# Patient Record
Sex: Female | Born: 1960 | Race: White | Hispanic: No | Marital: Married | Smoking: Never smoker
Health system: Southern US, Community
[De-identification: ages and names within clinical notes are randomized; demographics above are authoritative.]

## PROBLEM LIST (undated history)

## (undated) ENCOUNTER — Ambulatory Visit: Admission: EM | Payer: Self-pay | Source: Home / Self Care

## (undated) DIAGNOSIS — I10 Essential (primary) hypertension: Secondary | ICD-10-CM

## (undated) DIAGNOSIS — N3941 Urge incontinence: Secondary | ICD-10-CM

## (undated) DIAGNOSIS — K219 Gastro-esophageal reflux disease without esophagitis: Secondary | ICD-10-CM

## (undated) DIAGNOSIS — T7840XA Allergy, unspecified, initial encounter: Secondary | ICD-10-CM

## (undated) HISTORY — DX: Gastro-esophageal reflux disease without esophagitis: K21.9

## (undated) HISTORY — DX: Essential (primary) hypertension: I10

## (undated) HISTORY — PX: TOE SURGERY: SHX1073

## (undated) HISTORY — DX: Urge incontinence: N39.41

## (undated) HISTORY — DX: Allergy, unspecified, initial encounter: T78.40XA

---

## 2015-01-09 DIAGNOSIS — T7840XA Allergy, unspecified, initial encounter: Secondary | ICD-10-CM | POA: Insufficient documentation

## 2015-01-09 DIAGNOSIS — I10 Essential (primary) hypertension: Secondary | ICD-10-CM | POA: Insufficient documentation

## 2015-01-09 DIAGNOSIS — N3941 Urge incontinence: Secondary | ICD-10-CM | POA: Insufficient documentation

## 2015-01-09 DIAGNOSIS — K219 Gastro-esophageal reflux disease without esophagitis: Secondary | ICD-10-CM | POA: Insufficient documentation

## 2015-01-11 ENCOUNTER — Encounter: Payer: Self-pay | Admitting: Family Medicine

## 2015-01-11 ENCOUNTER — Ambulatory Visit (INDEPENDENT_AMBULATORY_CARE_PROVIDER_SITE_OTHER): Payer: No Typology Code available for payment source | Admitting: Family Medicine

## 2015-01-11 VITALS — BP 142/88 | HR 72 | Temp 97.5°F | Wt 167.0 lb

## 2015-01-11 DIAGNOSIS — S46811A Strain of other muscles, fascia and tendons at shoulder and upper arm level, right arm, initial encounter: Secondary | ICD-10-CM

## 2015-01-11 DIAGNOSIS — K219 Gastro-esophageal reflux disease without esophagitis: Secondary | ICD-10-CM

## 2015-01-11 DIAGNOSIS — Q670 Congenital facial asymmetry: Secondary | ICD-10-CM | POA: Insufficient documentation

## 2015-01-11 DIAGNOSIS — R51 Headache: Secondary | ICD-10-CM | POA: Diagnosis not present

## 2015-01-11 DIAGNOSIS — R197 Diarrhea, unspecified: Secondary | ICD-10-CM | POA: Insufficient documentation

## 2015-01-11 DIAGNOSIS — R519 Headache, unspecified: Secondary | ICD-10-CM | POA: Insufficient documentation

## 2015-01-11 DIAGNOSIS — S46819A Strain of other muscles, fascia and tendons at shoulder and upper arm level, unspecified arm, initial encounter: Secondary | ICD-10-CM | POA: Insufficient documentation

## 2015-01-11 MED ORDER — CYCLOBENZAPRINE HCL 10 MG PO TABS: 10.0000 mg | ORAL_TABLET | Freq: Three times a day (TID) | ORAL | Status: DC | PRN

## 2015-01-11 MED ORDER — GABAPENTIN 300 MG PO CAPS: 300.0000 mg | ORAL_CAPSULE | Freq: Every day | ORAL | Status: DC

## 2015-01-11 NOTE — Assessment & Plan Note (Signed)
I explained to the patient that her right eye and right cheekbone appear higher to me, and encouraged her to check old pictures to see if this is a change over the years or if her appearance in facial structure has been constant; if this is normal for her, then no concern; if her facial structure has changed, then I would get a CT scan of maxillofacial bones

## 2015-01-11 NOTE — Assessment & Plan Note (Signed)
Suggested ergonomic evaluation at work, stretching, turmeric (she does not sound like she will tolerate NSAIDs with her stomach complaints and GERD); if persistent, then she may talk to her employer about seeing a worker's comp provider for further evaluation if she truly thinks her pain is from her job; muscle relaxant Rxd, cautions given

## 2015-01-11 NOTE — Assessment & Plan Note (Signed)
Consider cervical xray to evaluate for arthritis; start gabapentin; she may have cervical spine/disc disease which may be related to the shoulder pain

## 2015-01-11 NOTE — Assessment & Plan Note (Signed)
Will hold on NSAIDs today and use other options for her shoulder issues

## 2015-01-11 NOTE — Assessment & Plan Note (Signed)
Will have patient try gluten free diet for one month; information given in the after visit summary

## 2015-01-11 NOTE — Progress Notes (Signed)
BP 142/88 mmHg  Pulse 72  Temp(Src) 97.5 F (36.4 C)  Wt 167 lb (75.751 kg)  SpO2 99%  LMP 10/28/2014 (Approximate)   Subjective:    Patient ID: Megan Bruce, female    DOB: 04-18-1961, 54 y.o.   MRN: [id number]  HPI: Megan Bruce is a 54 y.o. female  Chief Complaint  Patient presents with  . Ear Pain    right side, travels up arm and into neck and into ear   She is here; not sure if she has a isinus infection on the right side Sometimes swells up on the right side Also has pain on the right side in her shoulder and into the head and makes her ear hurt Right arm when she got her physical, didn't hurt back into the shoulder; [place] The right deltoid is okay; it's just in the right shoulder She works in [employer], [employer], has yarn and pops and leans over; hours and hours; using the same arm over and over; right-handed; she was on vacation last week and gave it some rest; it got a little better but now that's gone back it is starting to get irritated again She doesn't think she is tall enough to work some of the machines she thinks; no ergonomic evaluation at work; the last lady who worked there also complained about her shoulder and arm She has tried heating pad and it felt better; not clearing the back of the head If she looks a certain way, her neck will hurt; feels like a nerve just landed; she has hx of pulled muscle in her hip years ago, but this is different; if she moves too quickly, it triggers it [place] 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 Interim medical history since our last visit reviewed. Allergies and medications reviewed and updated.  Review of Systems  Constitutional: Negative for  fever.  HENT: Positive for ear pain, facial swelling, postnasal drip, rhinorrhea and sneezing. Negative for ear discharge, hearing loss, nosebleeds, sinus pressure, sore throat (a little dry), tinnitus, trouble swallowing and voice change.        Felt like something popped and felt a little wet on the inside on the right  Eyes:       Right eye gets irritated; has had eye drops in the past; gets fluid under the eye  Respiratory: Negative for cough.   Gastrointestinal: Negative for blood in stool.       Can eat something and her stomach wants to run off; stomach has been off for a while; taking advil, when she eats something she has to run to the bathroom; stomach sensitive to advil; might be IBS someone said  Musculoskeletal: Negative for neck stiffness.  Hematological: Negative for adenopathy.   Per HPI unless specifically indicated above     Objective:    BP 142/88 mmHg  Pulse 72  Temp(Src) 97.5 F (36.4 C)  Wt 167 lb (75.751 kg)  SpO2 99%  LMP 10/28/2014 (Approximate)  Wt Readings from Last 3 Encounters:  01/11/15 167 lb (75.751 kg)  11/14/14 166 lb (75.297 kg)    Physical Exam  Constitutional: She appears well-developed and well-nourished.  HENT:  Head: Atraumatic. Head is without  right periorbital erythema.  Right Ear: Hearing, external ear and ear canal normal.  Left Ear: Hearing, external ear and ear canal normal.  Nose: Septal deviation present. No mucosal edema, rhinorrhea or sinus tenderness. No epistaxis. Right sinus exhibits no maxillary sinus tenderness and no frontal sinus tenderness. Left sinus exhibits no maxillary sinus tenderness and no frontal sinus tenderness.  Mouth/Throat: No oral lesions.  Right eye and right cheekbone ride slightly higher than the left, right zygomatic arch a little more prominent than left; slightly cloudy appearance to the left TM  Eyes: Right eye exhibits no discharge and no exudate. Left eye exhibits no discharge and no exudate. Right  conjunctiva is not injected. Left conjunctiva is not injected. No scleral icterus. Right eye exhibits normal extraocular motion. Left eye exhibits normal extraocular motion. Pupils are equal.  Neck: Muscular tenderness (right side posterolateral neck, trapezius tender with tightness) present. No rigidity. No edema present. No thyromegaly present.  Cardiovascular: Normal rate and regular rhythm.   Pulmonary/Chest: Effort normal and breath sounds normal.  Abdominal: There is no tenderness. There is no guarding.  Skin: Rash is not vesicular.  Psychiatric: She has a normal mood and affect.    No results found for this or any previous visit.    Assessment & Plan:   Problem List Items Addressed This Visit      Digestive   GERD (gastroesophageal reflux disease)    Will hold on NSAIDs today and use other options for her shoulder issues        Musculoskeletal and Integument   Trapezius muscle strain - Primary    Suggested ergonomic evaluation at work, stretching, turmeric (she does not sound like she will tolerate NSAIDs with her stomach complaints and GERD); if persistent, then she may talk to her employer about seeing a worker's comp provider for further evaluation if she truly thinks her pain is from her job; muscle relaxant Rxd, cautions given      Facial asymmetry    I explained to the patient that her right eye and right cheekbone appear higher to me, and encouraged her to check old pictures to see if this is a change over the years or if her appearance in facial structure has been constant; if this is normal for her, then no concern; if her facial structure has changed, then I would get a CT scan of maxillofacial bones        Other   Frequent loose stools    Will have patient try gluten free diet for one month; information given in the after visit summary      Occipital pain    Consider cervical xray to evaluate for arthritis; start gabapentin; she may have cervical spine/disc  disease which may be related to the shoulder pain      Relevant Medications   cyclobenzaprine (FLEXERIL) 10 MG tablet   gabapentin (NEURONTIN) 300 MG capsule       Follow up plan: Return in about 4 weeks (around 02/08/2015) for re-evaluation.

## 2015-01-11 NOTE — Patient Instructions (Addendum)
We'll ask your employer to do an ergonomic evaluation of you at your workstation using your equipment to see if there are accommodations or adjustments that need to be made for your job Try heat before work and ice after, 15 minutes at a time Check old pictures to see if there is any difference in your appearance, especially over the right side of the eye or cheekbone Use the muscle relaxants if needed, but do not drive or operate machinery for eight hours after taking the medicine No alcohol with the muscle relaxants Try going completely gluten free for one month and see if that helps Try turmeric as a natural anti-inflammatory (for pain and arthritis). It comes in capsules where you buy aspirin and fish oil, but also as a spice where you buy pepper and garlic powder. Check-out YouTube for trapezius and shoulder stretches, or you are welcome to call for a referral to physical therapy Return in 4 weeks for re-evaluation   Gluten-Free Diet for Celiac Disease Gluten is a protein found in wheat, rye, barley, and triticale (a cross between wheat and rye) grains. People with celiac disease need to have a gluten-free diet. With celiac disease, gluten interferes with the absorption of food and may also cause intestinal injury.  Strict compliance is important even during symptom-free periods. This means eliminating all foods with gluten from your diet permanently. This requires some significant changes but is very manageable. WHAT DO I NEED TO KNOW ABOUT A GLUTEN-FREE DIET?  Look for items labeled with "GF." Looking for GF will make it easier to identify products that are safe to eat.  Read all labels. Gluten may have been added as a minor ingredient where least expected, such as in shredded cheeses or ice creams. Always check food labels and investigate questionable ingredients. Talk to your dietitian or health care provider if you have questions about certain foods or need help finding GF foods.  Check  when in doubt. If you are not sure whether an ingredient contains gluten, check with the manufacturer. Note that some manufacturers may change ingredients without notice. Always read labels.   Know how food is prepared. Since flour and cereal products are often used in the preparation of foods, it is important to be aware of the methods of preparation used, as well as the ingredients in the foods themselves. This is especially true when you are dining out. Ask restaurants if they have a gluten-free menu.  Watch for cross-contamination. Cross-contamination occurs when gluten-free foods come into contact with foods that contain gluten. It often happens during the manufacturing process. Always check the ingredient list and for warnings on packages, such as "may contain gluten."  Eat a balanced diet. It is important to still get enough fiber, iron, and B vitamins in your diet. Look for enriched whole grain gluten-free products and continue to eat a well-balanced diet of the important non-grain items, such as vegetables, fruit, lean proteins, legumes, and dairy.  Consider taking a gluten-free multivitamin and mineral supplement. Discuss this with your health care provider. WHAT KEY WORDS HELP IDENTIFY GLUTEN? Know key words to help identify gluten. A dietitian can help you identify possible harmful ingredients in the foods you normally eat. Words to check for on food labels include:   Flour, enriched flour, bromated flour, white flour, durum flour, graham flour, phosphated flour, self-rising flour, semolina, or farina.  Starch, dextrin, modified food starch, or cereal.  Thickening, fillers, or emulsifiers.  Any kind of malt flavoring, extract, or  syrup (malt is made from barley and includes malt vinegar, malted milk, and malted beverages).  Hydrolyzed vegetable protein. WHAT FOODS CAN I EAT? Below is a list of common foods that are allowed with a gluten-free diet.  Grains Products made from the  following flours or grains:amaranth,bean flours, 100% buckwheat flour, corn, millet, nut flours or meals, GF oats, quinoa, rice, sorghum, teff, any all-purpose 100% GF flour mix, rice wafers, pure cornmeal tortillas, popcorn, some crackers, some chips, and hot cereals made from cornmeal. Ask your dietitian which specific hot and cold cereals are allowed. Hominy, rice or wild rice, and special GF pasta. Some Asian rice noodles or bean noodles. Arrowroot starch, corn bran, corn flour, corn germ, cornmeal, corn starch, potato flour, potato starch flour, and rice bran. Rice flours: plain, brown, and sweet. Rice polish, soy flour, tapioca starch. Vegetables All plain, fresh, frozen, or canned vegetables.  Fruits All fresh, frozen, canned, dried fruits, and fruit juices.  Meats and Other Protein Foods Meat, fish, poultry, or eggs prepared without added wheat, rye, barley, or triticale. Some luncheon meat and some frankfurters. Pure meat. All aged cheese, most processed cheese products, some cottage cheese, and some cream cheese. Dried beans, dried peas, and lentils.  Dairy Milk and yogurt made with allowed ingredients.  Beverages Coffee (regular or decaffeinated), tea, herbal tea (read label to be sure that no wheat flour has been added). Carbonated beverages and some root beers. Wine, sake, and distilled spirits, such as gin, vodka, and whiskey. GF beers and GF ciders.  Sweetsand Desserts Sugar, honey, some syrups, molasses, jelly, jam, plain hard candy, marshmallows, gumdrops, homemade candies free of wheat, rye, barley, or triticale. Coconut. Custard, some pudding mixes, and homemade puddings from cornstarch, rice, and tapioca. Gelatin desserts, sorbets, frozen ice pops, and sherbet. Cake, cookies, and other desserts prepared with allowed flours. Some commercial ice creams. Ask your dietitian about specific brands of dessert that are allowed.  Fats and Oils Butter, margarine, vegetable oil, sour  cream not containing modified food starch, whipping cream, shortening, lard, cream, and some mayonnaise. Some commercial salad dressings. Peanut butter.  Other Homemade broth and soups made with allowed ingredients; some canned or frozen soups. Any other combination or prepared foods that do not contain gluten. Monosodium glutamate (MSG). Cider, rice, and wine vinegar. Baking soda and baking powder. Certain soy sauces (Tamari). Ask your dietitian about specific brands that are allowed. Nuts, coconut, chocolate, and pure cocoa powder. Salt, pepper, herbs, spices, extracts, and food colorings. The items listed above may not be a complete list of allowed foods or beverages. Contact your dietitian for more options.  WHAT FOODS CAN I NOT EAT? Below is a list of common foods that are not allowed with a gluten-free diet.  Grains Barley, bran, bulgur, cracked wheat, graham, malt, matzo, wheat germ, and all wheat and rye cereals including spelt and kamut. Avoid cereals containing malt as a flavoring, such as rice cereal. Also avoid regular noodles, spaghetti, macaroni, and most packaged rice mixes, and all others containing wheat, rye, barley, or triticale.  Vegetables Most creamed vegetables, most vegetables canned in sauces, and any vegetables prepared with wheat, rye, barley, or triticale.  Fruits Thickened or prepared fruits and some pie fillings.  Meats and Other Protein Sources Any meat or meat alternative containing wheat, rye, barley, or gluten stabilizers (such as some hot dogs, salami, cold cuts, or sausage). Bread-containing products, such as Swiss steak, croquettes, and meatloaf. Most tuna canned in vegetable broth, [place]urkey with  hydrolyzed vegetable protein (HVP) injected as part of the basting, and any cheese product containing oat gum as an ingredient. Seitan. Imitation fish. Dairy Commercial chocolate milk, which may have cereal added, and malted milk. Beverages Certain cereal beverages.  Beer and ciders (unless GF), ale, malted milk, and some root beers. Sweetsand Desserts Commercial candies containing wheat, rye, barley, or triticale. Certain toffees are dusted with wheat flour. Chocolate-coated nuts, which are often rolled in flour. Cakes, cookies, doughnuts, and pastries that are prepared with wheat, barley, rye, or triticale flour. Some commercial ice creams, ice cream flavors which contain cookies, crumbs, or cheesecake. Ice cream cones. Commercially prepared mixes for cakes, cookies, and other desserts unless marked GF. Bread pudding and other puddings thickened with flour. Fats and Oils Some commercial salad dressings and sour cream containing modified food starch.  Condiments Some curry powder, some dry seasoning mixes, some gravy extracts, some meat sauces, some ketchup, some prepared mustard, horseradish. Other All soups containing wheat, rye, barley, or triticale flour. Bouillon and bouillon cubes that contain HVP. Combination or prepared foods that contain gluten. Some soy sauce, some chip dips, and some chewing gum. Yeast extract (contains barley). Caramel color (may contain malt). The items listed above may not be a complete list of foods and beverages to avoid. Contact your dietitian for more information. Document Released: 06/16/2005 Document Revised: 10/31/2013 Document Reviewed: 04/20/2013 [facility] Patient Information 2015 [place], [place]. This information is not intended to replace advice given to you by your health care provider. Make sure you discuss any questions you have with your health care provider.

## 2015-02-15 ENCOUNTER — Ambulatory Visit (INDEPENDENT_AMBULATORY_CARE_PROVIDER_SITE_OTHER): Payer: No Typology Code available for payment source | Admitting: Family Medicine

## 2015-02-15 ENCOUNTER — Encounter: Payer: Self-pay | Admitting: Family Medicine

## 2015-02-15 VITALS — BP 155/81 | HR 68 | Temp 98.8°F | Ht <= 58 in | Wt 165.0 lb

## 2015-02-15 DIAGNOSIS — S46811D Strain of other muscles, fascia and tendons at shoulder and upper arm level, right arm, subsequent encounter: Secondary | ICD-10-CM

## 2015-02-15 DIAGNOSIS — I10 Essential (primary) hypertension: Secondary | ICD-10-CM

## 2015-02-15 DIAGNOSIS — H1013 Acute atopic conjunctivitis, bilateral: Secondary | ICD-10-CM

## 2015-02-15 DIAGNOSIS — R51 Headache: Secondary | ICD-10-CM | POA: Diagnosis not present

## 2015-02-15 DIAGNOSIS — R519 Headache, unspecified: Secondary | ICD-10-CM

## 2015-02-15 MED ORDER — OLOPATADINE HCL 0.1 % OP SOLN: 1.0000 [drp] | Freq: Two times a day (BID) | OPHTHALMIC | Status: DC

## 2015-02-15 MED ORDER — AMLODIPINE BESYLATE 5 MG PO TABS: 5.0000 mg | ORAL_TABLET | Freq: Every day | ORAL | Status: DC

## 2015-02-15 MED ORDER — CYCLOBENZAPRINE HCL 10 MG PO TABS: 10.0000 mg | ORAL_TABLET | Freq: Three times a day (TID) | ORAL | Status: DC | PRN

## 2015-02-15 MED ORDER — GABAPENTIN 300 MG PO CAPS: 300.0000 mg | ORAL_CAPSULE | Freq: Every day | ORAL | Status: DC

## 2015-02-15 NOTE — Assessment & Plan Note (Signed)
Much improved on the gabapentin; continue Rx

## 2015-02-15 NOTE — Progress Notes (Signed)
BP 155/81 mmHg  Pulse 68  Temp(Src) 98.8 F (37.1 C)  Ht 4' 9.25" (1.454 m)  Wt 165 lb (74.844 kg)  BMI 35.40 kg/m2  SpO2 99%  LMP 12/29/2014 (Approximate)   Subjective:    Patient ID: Megan Bruce, female    DOB: May 28, 1961, 54 y.o.   MRN: [id number]  HPI: Geanine Vandekamp is a 54 y.o. female  Chief Complaint  Patient presents with  . Neck Pain    follow up   She is here for neck pain; going up into the head on the right side; the neck is doing better; pain is less; doesn't shoot up as much; still agitates it a little; neck is better; new job works her arm a lot; aggravates the nerve; does not shoot pain or electricity into the arm; she is using flexeril 10 mg and it helps but she speaks her mind on the medicine; flexeril upon coming home and gabapentin at night; not goofy or drunk feeling; neurontin is working; the nerves aren't hitting her head  Hypertension; does not check away from doctor; on the 5 mg CCB; going to have a physical soon; she just left work; has been on this dose for a while; thinks usually controlled but today there were some things going on at work that may have raised BP; she wants to stay on same dose  Allergies, dry things out some and has some allergies in her eyes too; using eye drops from the store; itchy and watery eyes  Relevant past medical, surgical, family and social history reviewed and updated as indicated. Interim medical history since our last visit reviewed. Allergies and medications reviewed and updated.  Review of Systems  Per HPI unless specifically indicated above     Objective:    BP 155/81 mmHg  Pulse 68  Temp(Src) 98.8 F (37.1 C)  Ht 4' 9.25" (1.454 m)  Wt 165 lb (74.844 kg)  BMI 35.40 kg/m2  SpO2 99%  LMP 12/29/2014 (Approximate)  Wt Readings from Last 3 Encounters:  02/15/15 165 lb (74.844 kg)  01/11/15 167 lb (75.751 kg)  11/14/14 166 lb (75.297 kg)    Physical Exam  Constitutional: She appears well-developed and  well-nourished.  Eyes: Right eye exhibits no discharge. Left eye exhibits no discharge. No scleral icterus.  Neck: Normal range of motion. Neck supple.  Pulmonary/Chest: Effort normal.  Neurological: She has normal strength. She displays no atrophy and no tremor. She exhibits normal muscle tone.  Grip strength 5/5  Psychiatric: She has a normal mood and affect. Her speech is normal and behavior is normal. Judgment and thought content normal. Cognition and memory are normal.   No results found for this or any previous visit.    Assessment & Plan:   Problem List Items Addressed This Visit      Cardiovascular and Mediastinum   Hypertension    Continue same dose; not controlled but patient wants to stay on current dose      Relevant Medications   amLODipine (NORVASC) 5 MG tablet     Musculoskeletal and Integument   Trapezius muscle strain - Primary    Refill the medicines; stretching and strengthening; she declined PT; okay to see chiropractor        Other   Occipital pain    Much improved on the gabapentin; continue Rx      Relevant Medications   amLODipine (NORVASC) 5 MG tablet   cyclobenzaprine (FLEXERIL) 10 MG tablet   gabapentin (NEURONTIN) 300 MG  capsule   Acute allergic conjunctivitis of both eyes    Start new eye drops, avoid triggers         Follow up plan: Return in about 6 months (around 08/18/2015) for hypertension.  Meds ordered this encounter  Medications  . olopatadine (PATANOL) 0.1 % ophthalmic solution    Sig: Place 1 drop into both eyes 2 (two) times daily.    Dispense:  5 mL    Refill:  12  . amLODipine (NORVASC) 5 MG tablet    Sig: Take 1 tablet (5 mg total) by mouth daily.    Dispense:  30 tablet    Refill:  6  . cyclobenzaprine (FLEXERIL) 10 MG tablet    Sig: Take 1 tablet (10 mg total) by mouth every 8 (eight) hours as needed for muscle spasms. Do not drive or operate machinery for 8 hours after taking    Dispense:  30 tablet    Refill:  2   . gabapentin (NEURONTIN) 300 MG capsule    Sig: Take 1 capsule (300 mg total) by mouth at bedtime.    Dispense:  30 capsule    Refill:  6

## 2015-02-15 NOTE — Assessment & Plan Note (Signed)
Refill the medicines; stretching and strengthening; she declined PT; okay to see chiropractor

## 2015-02-15 NOTE — Assessment & Plan Note (Signed)
Start new eye drops, avoid triggers

## 2015-02-15 NOTE — Patient Instructions (Addendum)
Okay to continue your current medicines Consider seeing a chiropractor If you'd like to see a physical therapist, just call and we can make that referral Try the DASH guidelines for your blood pressure Check your blood pressure from time to time and call me if not under 140 on top consistently Limit salt  DASH Eating Plan DASH stands for "Dietary Approaches to Stop Hypertension." The DASH eating plan is a healthy eating plan that has been shown to reduce high blood pressure (hypertension). Additional health benefits may include reducing the risk of type 2 diabetes mellitus, heart disease, and stroke. The DASH eating plan may also help with weight loss. WHAT DO I NEED TO KNOW ABOUT THE DASH EATING PLAN? For the DASH eating plan, you will follow these general guidelines:  Choose foods with a percent daily value for sodium of less than 5% (as listed on the food label).  Use salt-free seasonings or herbs instead of table salt or sea salt.  Check with your health care provider or pharmacist before using salt substitutes.  Eat lower-sodium products, often labeled as "lower sodium" or "no salt added."  Eat fresh foods.  Eat more vegetables, fruits, and low-fat dairy products.  Choose whole grains. Look for the word "whole" as the first word in the ingredient list.  Choose fish and skinless chicken or [place] more often than red meat. Limit fish, poultry, and meat to 6 oz (170 g) each day.  Limit sweets, desserts, sugars, and sugary drinks.  Choose heart-healthy fats.  Limit cheese to 1 oz (28 g) per day.  Eat more home-cooked food and less restaurant, buffet, and fast food.  Limit fried foods.  Cook foods using methods other than frying.  Limit canned vegetables. If you do use them, rinse them well to decrease the sodium.  When eating at a restaurant, ask that your food be prepared with less salt, or no salt if possible. WHAT FOODS CAN I EAT? Seek help from a dietitian for  individual calorie needs. Grains Whole grain or whole wheat bread. Brown rice. Whole grain or whole wheat pasta. Quinoa, bulgur, and whole grain cereals. Low-sodium cereals. Corn or whole wheat flour tortillas. Whole grain cornbread. Whole grain crackers. Low-sodium crackers. Vegetables Fresh or frozen vegetables (raw, steamed, roasted, or grilled). Low-sodium or reduced-sodium tomato and vegetable juices. Low-sodium or reduced-sodium tomato sauce and paste. Low-sodium or reduced-sodium canned vegetables.  Fruits All fresh, canned (in natural juice), or frozen fruits. Meat and Other Protein Products Ground beef (85% or leaner), grass-fed beef, or beef trimmed of fat. Skinless chicken or [place]. Ground chicken or [place]. Pork trimmed of fat. All fish and seafood. Eggs. Dried beans, peas, or lentils. Unsalted nuts and seeds. Unsalted canned beans. Dairy Low-fat dairy products, such as skim or 1% milk, 2% or reduced-fat cheeses, low-fat ricotta or cottage cheese, or plain low-fat yogurt. Low-sodium or reduced-sodium cheeses. Fats and Oils Tub margarines without trans fats. Light or reduced-fat mayonnaise and salad dressings (reduced sodium). Avocado. Safflower, olive, or canola oils. Natural peanut or almond butter. Other Unsalted popcorn and pretzels. The items listed above may not be a complete list of recommended foods or beverages. Contact your dietitian for more options. WHAT FOODS ARE NOT RECOMMENDED? Grains White bread. White pasta. White rice. Refined cornbread. Bagels and croissants. Crackers that contain trans fat. Vegetables Creamed or fried vegetables. Vegetables in a cheese sauce. Regular canned vegetables. Regular canned tomato sauce and paste. Regular tomato and vegetable juices. Fruits Dried fruits. Canned fruit  in light or heavy syrup. Fruit juice. Meat and Other Protein Products Fatty cuts of meat. Ribs, chicken wings, bacon, sausage, bologna, salami, chitterlings, fatback, hot  dogs, bratwurst, and packaged luncheon meats. Salted nuts and seeds. Canned beans with salt. Dairy Whole or 2% milk, cream, half-and-half, and cream cheese. Whole-fat or sweetened yogurt. Full-fat cheeses or blue cheese. Nondairy creamers and whipped toppings. Processed cheese, cheese spreads, or cheese curds. Condiments Onion and garlic salt, seasoned salt, table salt, and sea salt. Canned and packaged gravies. Worcestershire sauce. Tartar sauce. Barbecue sauce. Teriyaki sauce. Soy sauce, including reduced sodium. Steak sauce. Fish sauce. Oyster sauce. Cocktail sauce. Horseradish. Ketchup and mustard. Meat flavorings and tenderizers. Bouillon cubes. Hot sauce. Tabasco sauce. Marinades. Taco seasonings. Relishes. Fats and Oils Butter, stick margarine, lard, shortening, ghee, and bacon fat. Coconut, palm kernel, or palm oils. Regular salad dressings. Other Pickles and olives. Salted popcorn and pretzels. The items listed above may not be a complete list of foods and beverages to avoid. Contact your dietitian for more information. WHERE CAN I FIND MORE INFORMATION? National Heart, Lung, and Blood Institute: CableProm[employer].itwww.nhlbi.nih.gov/health/health-topics/topics/dash/ Document Released: 06/05/2011 Document Revised: 10/31/2013 Document Reviewed: 04/20/2013 [employer] Patient Information 2015 [employer] [place]. This information is not intended to replace advice given to you by your health care provider. Make sure you discuss any questions you have with your health care provider.

## 2015-02-15 NOTE — Assessment & Plan Note (Signed)
Continue same dose; not controlled but patient wants to stay on current dose

## 2015-02-16 ENCOUNTER — Telehealth: Payer: Self-pay

## 2015-02-16 MED ORDER — KETOTIFEN FUMARATE 0.025 % OP SOLN: 1.0000 [drp] | Freq: Two times a day (BID) | OPHTHALMIC | Status: DC

## 2015-02-16 NOTE — Telephone Encounter (Signed)
Yes, new Rx sent to pharmacy

## 2015-02-16 NOTE — Telephone Encounter (Signed)
The Patanol eye drops are $161. She wants to know if you can change it to something cheaper.

## 2015-02-19 ENCOUNTER — Telehealth: Payer: Self-pay | Admitting: Family Medicine

## 2015-02-19 NOTE — Telephone Encounter (Signed)
I spoke with the pharmacy, this is an old message in regards to the first med we sent in. The second med we sent in is only $3.

## 2015-02-19 NOTE — Telephone Encounter (Signed)
[facility] in [place] called stated pt wants to change eye drop RX to something less expensive. Thanks.

## 2015-04-19 ENCOUNTER — Ambulatory Visit (INDEPENDENT_AMBULATORY_CARE_PROVIDER_SITE_OTHER): Payer: No Typology Code available for payment source | Admitting: Family Medicine

## 2015-04-19 ENCOUNTER — Encounter: Payer: Self-pay | Admitting: Family Medicine

## 2015-04-19 VITALS — BP 137/83 | HR 74 | Temp 98.7°F | Ht <= 58 in | Wt 162.8 lb

## 2015-04-19 DIAGNOSIS — R1031 Right lower quadrant pain: Secondary | ICD-10-CM | POA: Diagnosis not present

## 2015-04-19 DIAGNOSIS — R634 Abnormal weight loss: Secondary | ICD-10-CM | POA: Diagnosis not present

## 2015-04-19 DIAGNOSIS — R194 Change in bowel habit: Secondary | ICD-10-CM | POA: Insufficient documentation

## 2015-04-19 DIAGNOSIS — R140 Abdominal distension (gaseous): Secondary | ICD-10-CM | POA: Diagnosis not present

## 2015-04-19 DIAGNOSIS — R197 Diarrhea, unspecified: Secondary | ICD-10-CM | POA: Diagnosis not present

## 2015-04-19 DIAGNOSIS — R1032 Left lower quadrant pain: Secondary | ICD-10-CM | POA: Diagnosis not present

## 2015-04-19 MED ORDER — NITROFURANTOIN MONOHYD MACRO 100 MG PO CAPS: 100.0000 mg | ORAL_CAPSULE | Freq: Two times a day (BID) | ORAL | Status: DC

## 2015-04-19 NOTE — Patient Instructions (Signed)
Please do start the antibiotics My staff will contact you about the ultrasound and the colonoscopy referral Try to stay well-hydrated If symptoms worsen, call me or if severe then go to the ER

## 2015-04-19 NOTE — Progress Notes (Signed)
BP 137/83 mmHg  Pulse 74  Temp(Src) 98.7 F (37.1 C)  Ht 4' 9.6" (1.463 m)  Wt 162 lb 12.8 oz (73.846 kg)  BMI 34.50 kg/m2  SpO2 100%  LMP 03/01/2015 (Approximate)   Subjective:    Patient ID: Megan Bruce, female    DOB: 03-Nov-1960, 54 y.o.   MRN: [id number]  HPI: Envi Eagleson is a 54 y.o. female  Chief Complaint  Patient presents with  . Abdominal Pain    pt states she has pain in her stomach that started yesterday. States this is the third time she has had the pain and states it feels "gasey". Pt states pain makes her feel nauseated   She says her belly is in a knot, then has a blow out of hard stool and then followed by runny stool; it seems like it builds up and then releases She was sick at work yesterday, had to leave and come home; stomach has felt so gassy; husband got her a dulcolax suppository and air just came out, passed gas So much pressure and aching No blood in the stool; no change in caliber Stomach got to hurting so bad; this is the third time it's happened, every 2 to 3 weeks, she can't go to the bathroom, it just builds up Decreased appetite; nauseated; no vomiting; no dysuria She has had a lot of pressure in her pelvic area Foul smell; like a lot of gas Everybody at work thinks it is stress; she gets irritable She had the implant removed She has not been sexually active, so she says she cannot be pregnant She has lost five pounds in the last 3 months; still has ovaries  Relevant past medical, surgical, family and social history reviewed and updated as indicated. Interim medical history since our last visit reviewed. Allergies and medications reviewed and updated.  Review of Systems Per HPI unless specifically indicated above     Objective:    BP 137/83 mmHg  Pulse 74  Temp(Src) 98.7 F (37.1 C)  Ht 4' 9.6" (1.463 m)  Wt 162 lb 12.8 oz (73.846 kg)  BMI 34.50 kg/m2  SpO2 100%  LMP 03/01/2015 (Approximate)  Wt Readings from Last 3 Encounters:   04/19/15 162 lb 12.8 oz (73.846 kg)  02/15/15 165 lb (74.844 kg)  01/11/15 167 lb (75.751 kg)    Physical Exam  Constitutional: She appears well-developed and well-nourished.  Weight loss five pounds in 3 months  Eyes:  No proptosis  Cardiovascular: Normal rate and regular rhythm.   Pulmonary/Chest: Effort normal and breath sounds normal.  Abdominal: Bowel sounds are normal. She exhibits no distension.  Neurological: She displays no tremor.  Skin: No rash noted. She is not diaphoretic. No pallor.  Psychiatric: She has a normal mood and affect.      Assessment & Plan:   Problem List Items Addressed This Visit      Other   Frequent loose stools    Possibility of blockage allowing only looser stool around it; could be large polyp or intermittent partial bowel obstruction; refer to gastroenterologist for evaluation, scope      Abdominal wall pain in both lower quadrants - Primary    Pelvic ultrasound, refer to gastroenterologist for consideration of colonoscopy; check labs      Relevant Orders   UA/M w/rflx Culture, Routine (Completed)   CBC With Differential/Platelet (Completed)   Comprehensive metabolic panel (Completed)   US Transvaginal Non-OB   US Pelvis Complete   Ambulatory referral to Gastroenterology  Change in bowel habits    Labs, refer to GI; health maintenance tab says due in 2017, but patient actually declined the colonoscopy per clinic staff      Relevant Orders   Ambulatory referral to Gastroenterology   Abdominal bloating    Patient has ovaries, will start with transvaginal and pelvic ultrasound to r/o ovarian cancer      Relevant Orders   UA/M w/rflx Culture, Routine (Completed)   CBC With Differential/Platelet (Completed)   Comprehensive metabolic panel (Completed)   US Transvaginal Non-OB   US Pelvis Complete   Ambulatory referral to Gastroenterology    Other Visit Diagnoses    Unintentional weight loss        Relevant Orders    TSH  (Completed)    US Transvaginal Non-OB    US Pelvis Complete    Ambulatory referral to Gastroenterology       Follow up plan: Return 10-12 days, for recheck.  Orders Placed This Encounter  Procedures  . Microscopic Examination  . US Transvaginal Non-OB  . US Pelvis Complete  . UA/M w/rflx Culture, Routine  . CBC With Differential/Platelet  . Comprehensive metabolic panel  . TSH  . Urine Culture, Routine  . Ambulatory referral to Gastroenterology   An after-visit summary was printed and given to the patient at check-out.  Please see the patient instructions which may contain other information and recommendations beyond what is mentioned above in the assessment and plan.

## 2015-04-20 ENCOUNTER — Ambulatory Visit: Payer: No Typology Code available for payment source | Admitting: Family Medicine

## 2015-04-20 LAB — COMPREHENSIVE METABOLIC PANEL
A/G RATIO: 1.5 (ref 1.1–2.5)
ALT: 26 IU/L (ref 0–32)
AST: 28 IU/L (ref 0–40)
Albumin: 3.8 g/dL (ref 3.5–5.5)
Alkaline Phosphatase: 88 IU/L (ref 39–117)
BILIRUBIN TOTAL: 1.6 mg/dL — AB (ref 0.0–1.2)
BUN/Creatinine Ratio: 10 (ref 9–23)
BUN: 8 mg/dL (ref 6–24)
CO2: 23 mmol/L (ref 18–29)
Calcium: 8.8 mg/dL (ref 8.7–10.2)
Chloride: 98 mmol/L (ref 97–106)
Creatinine, Ser: 0.83 mg/dL (ref 0.57–1.00)
GFR, EST AFRICAN AMERICAN: 94 mL/min/{1.73_m2} (ref >59)
GFR, EST NON AFRICAN AMERICAN: 81 mL/min/{1.73_m2} (ref >59)
GLOBULIN, TOTAL: 2.5 g/dL (ref 1.5–4.5)
Glucose: 92 mg/dL (ref 65–99)
Potassium: 4.1 mmol/L (ref 3.5–5.2)
SODIUM: 136 mmol/L (ref 136–144)
TOTAL PROTEIN: 6.3 g/dL (ref 6.0–8.5)

## 2015-04-20 LAB — TSH: TSH: 1.05 u[IU]/mL (ref 0.450–4.500)

## 2015-04-24 LAB — UA/M W/RFLX CULTURE, ROUTINE
BILIRUBIN UA: NEGATIVE
Glucose, UA: NEGATIVE
LEUKOCYTES UA: NEGATIVE
Nitrite, UA: POSITIVE — AB
PH UA: 5 (ref 5.0–7.5)
Specific Gravity, UA: 1.025 (ref 1.005–1.030)
UUROB: 1 mg/dL (ref 0.2–1.0)

## 2015-04-24 LAB — MICROSCOPIC EXAMINATION
BACTERIA UA: NONE SEEN
RBC, UA: NONE SEEN /hpf (ref 0–?)

## 2015-04-24 LAB — URINE CULTURE, REFLEX

## 2015-04-24 LAB — CBC WITH DIFFERENTIAL/PLATELET
Hematocrit: 40.9 % (ref 34.0–46.6)
Hemoglobin: 14.5 g/dL (ref 11.1–15.9)
LYMPHS: 16 %
Lymphocytes Absolute: 1.8 10*3/uL (ref 0.7–3.1)
MCH: 33.3 pg — ABNORMAL HIGH (ref 26.6–33.0)
MCHC: 35.5 g/dL (ref 31.5–35.7)
MCV: 94 fL (ref 79–97)
MID (ABSOLUTE): 0.9 10*3/uL (ref 0.1–1.6)
MID: 8 %
NEUTROS PCT: 75 %
Neutrophils Absolute: 8.3 10*3/uL — ABNORMAL HIGH (ref 1.4–7.0)
Platelets: 226 10*3/uL (ref 150–379)
RBC: 4.35 x10E6/uL (ref 3.77–5.28)
RDW: 45 % — AB (ref 12.3–15.4)
WBC: 11 10*3/uL — ABNORMAL HIGH (ref 3.4–10.8)

## 2015-04-24 NOTE — Assessment & Plan Note (Signed)
Pelvic ultrasound, refer to gastroenterologist for consideration of colonoscopy; check labs

## 2015-04-24 NOTE — Assessment & Plan Note (Signed)
Labs, refer to GI; health maintenance tab says due in 2017, but patient actually declined the colonoscopy per clinic staff

## 2015-04-24 NOTE — Assessment & Plan Note (Signed)
Possibility of blockage allowing only looser stool around it; could be large polyp or intermittent partial bowel obstruction; refer to gastroenterologist for evaluation, scope

## 2015-04-24 NOTE — Assessment & Plan Note (Signed)
Patient has ovaries, will start with transvaginal and pelvic ultrasound to r/o ovarian cancer

## 2015-04-25 ENCOUNTER — Telehealth: Payer: Self-pay

## 2015-04-25 NOTE — Telephone Encounter (Signed)
Per Dr. Marlise Eveslada's lab message, she also needs an abd u/s for hyperbilirubinemia. Order placed.

## 2015-04-30 ENCOUNTER — Ambulatory Visit: Payer: No Typology Code available for payment source | Admitting: Family Medicine

## 2015-05-01 ENCOUNTER — Telehealth: Payer: Self-pay | Admitting: Family Medicine

## 2015-05-01 ENCOUNTER — Ambulatory Visit
Admission: RE | Admit: 2015-05-01 | Discharge: 2015-05-01 | Disposition: A | Discharge status: Home / Self Care | Payer: No Typology Code available for payment source | Source: Ambulatory Visit | Attending: Family Medicine | Admitting: Family Medicine

## 2015-05-01 DIAGNOSIS — N83202 Unspecified ovarian cyst, left side: Secondary | ICD-10-CM

## 2015-05-01 DIAGNOSIS — N83209 Unspecified ovarian cyst, unspecified side: Secondary | ICD-10-CM

## 2015-05-01 DIAGNOSIS — N839 Noninflammatory disorder of ovary, fallopian tube and broad ligament, unspecified: Secondary | ICD-10-CM | POA: Insufficient documentation

## 2015-05-01 DIAGNOSIS — R102 Pelvic and perineal pain: Secondary | ICD-10-CM | POA: Diagnosis present

## 2015-05-01 HISTORY — DX: Unspecified ovarian cyst, unspecified side: N83.209

## 2015-05-01 NOTE — Telephone Encounter (Signed)
I need to talk with patient about her US report; I called, left brief msg

## 2015-05-02 NOTE — Telephone Encounter (Signed)
I spoke with the patient about US results; cystic mass on ovary; discussed re-imaging 6-12 weeks, but with her symptoms, I want to get her to GYN now, let them evaluate her and decide next/fastest step if needed; she agrees

## 2015-05-02 NOTE — Assessment & Plan Note (Signed)
Refer to gyn 

## 2015-05-02 NOTE — Telephone Encounter (Signed)
Pt called stated she was returning Dr. Marlise EvesLada's call would like a call back ASAP. Thanks.

## 2015-05-09 ENCOUNTER — Telehealth: Payer: Self-pay

## 2015-05-09 NOTE — Telephone Encounter (Signed)
Patient left message she has upcoming appt and was wondering about it.

## 2015-05-09 NOTE — Telephone Encounter (Signed)
Left message to call.

## 2015-05-09 NOTE — Telephone Encounter (Signed)
Pt called stated it is best to call her after 3pm. Stated she is at work and would not be able to answer until 3:30pm. Thanks.

## 2015-05-11 NOTE — Telephone Encounter (Signed)
Left message to call.

## 2015-05-16 NOTE — Telephone Encounter (Signed)
Spoke with patient, she was just confused as to if her 2 ultrasounds were going to be done at the same time.

## 2015-05-17 ENCOUNTER — Ambulatory Visit: Payer: No Typology Code available for payment source | Admitting: Family Medicine

## 2015-05-22 ENCOUNTER — Encounter: Payer: No Typology Code available for payment source | Admitting: Obstetrics and Gynecology

## 2015-05-25 ENCOUNTER — Ambulatory Visit: Payer: No Typology Code available for payment source

## 2015-06-04 ENCOUNTER — Ambulatory Visit: Payer: No Typology Code available for payment source | Admitting: Gastroenterology

## 2015-06-07 ENCOUNTER — Encounter: Payer: No Typology Code available for payment source | Admitting: Obstetrics and Gynecology

## 2015-07-03 ENCOUNTER — Encounter: Payer: No Typology Code available for payment source | Admitting: Obstetrics and Gynecology

## 2015-07-20 ENCOUNTER — Ambulatory Visit (INDEPENDENT_AMBULATORY_CARE_PROVIDER_SITE_OTHER): Payer: No Typology Code available for payment source | Admitting: Family Medicine

## 2015-07-20 ENCOUNTER — Encounter: Payer: Self-pay | Admitting: Family Medicine

## 2015-07-20 VITALS — BP 157/98 | HR 68 | Temp 99.0°F | Ht <= 58 in | Wt 166.0 lb

## 2015-07-20 DIAGNOSIS — I10 Essential (primary) hypertension: Secondary | ICD-10-CM | POA: Diagnosis not present

## 2015-07-20 DIAGNOSIS — J0100 Acute maxillary sinusitis, unspecified: Secondary | ICD-10-CM | POA: Diagnosis not present

## 2015-07-20 MED ORDER — HYDROCOD POLST-CPM POLST ER 10-8 MG/5ML PO SUER: 5.0000 mL | Freq: Every evening | ORAL | Status: DC | PRN

## 2015-07-20 MED ORDER — AMOXICILLIN-POT CLAVULANATE 875-125 MG PO TABS: 1.0000 | ORAL_TABLET | Freq: Two times a day (BID) | ORAL | Status: DC

## 2015-07-20 NOTE — Assessment & Plan Note (Signed)
Strongly advised patient to restart her medicine and not to stop it when she has a cold. She will. Seeing PCP in 1 month, recheck BP at that time as she has been well controlled.

## 2015-07-20 NOTE — Progress Notes (Signed)
BP 157/98 mmHg  Pulse 68  Temp(Src) 99 F (37.2 C)  Ht 4' 9.2" (1.453 m)  Wt 166 lb (75.297 kg)  BMI 35.67 kg/m2  SpO2 99%  LMP 07/18/2015 (Approximate)   Subjective:    Patient ID: Megan Bruce, female    DOB: 1961/05/24, 55 y.o.   MRN: [id number]  HPI: Megan Bruce is a 55 y.o. female  Chief Complaint  Patient presents with  . URI    X 2 weeks, patient states that she has not taken any of her medicine because she has been taking OTC meds the the cough and congestion   UPPER RESPIRATORY TRACT INFECTION Duration: 2 weeks Worst symptom: congestion, cough Fever: no Cough: yes Shortness of breath: no Wheezing: no Chest pain: no Chest tightness: no Chest congestion: no Nasal congestion: yes Runny nose: no Post nasal drip: yes Sneezing: no Sore throat: no Swollen glands: no Sinus pressure: yes Headache: yes Face pain: yes Toothache: yes Ear pain: no  Ear pressure: no  Eyes red/itching:yes Eye drainage/crusting: no  Vomiting: yes Rash: no Fatigue: yes Sick contacts: no Strep contacts: no  Context: better Recurrent sinusitis: no Relief with OTC cold/cough medications: no  Treatments attempted: mucinex nasal saline  HYPERTENSION- has not been taking her blood pressure medicine. Hypertension status: uncontrolled  Satisfied with current treatment? yes Duration of hypertension: chronic BP monitoring frequency:  not checking BP medication side effects:  no Medication compliance: fair compliance Aspirin: no Recurrent headaches: no Visual changes: no Palpitations: no Dyspnea: no Chest pain: no Lower extremity edema: no Dizzy/lightheaded: no  Relevant past medical, surgical, family and social history reviewed and updated as indicated. Interim medical history since our last visit reviewed. Allergies and medications reviewed and updated.  Review of Systems  Constitutional: Negative.   HENT: Negative.   Respiratory: Negative.   Cardiovascular: Negative.    Psychiatric/Behavioral: Negative.     Per HPI unless specifically indicated above     Objective:    BP 157/98 mmHg  Pulse 68  Temp(Src) 99 F (37.2 C)  Ht 4' 9.2" (1.453 m)  Wt 166 lb (75.297 kg)  BMI 35.67 kg/m2  SpO2 99%  LMP 07/18/2015 (Approximate)  Wt Readings from Last 3 Encounters:  07/20/15 166 lb (75.297 kg)  04/19/15 162 lb 12.8 oz (73.846 kg)  02/15/15 165 lb (74.844 kg)    Physical Exam  Constitutional: She is oriented to person, place, and time. She appears well-developed and well-nourished. No distress.  HENT:  Head: Normocephalic and atraumatic.  Right Ear: Hearing, tympanic membrane, external ear and ear canal normal.  Left Ear: Hearing, tympanic membrane, external ear and ear canal normal.  Nose: Mucosal edema, rhinorrhea and sinus tenderness present. No nose lacerations, nasal deformity, septal deviation or nasal septal hematoma. No epistaxis.  No foreign bodies. Right sinus exhibits maxillary sinus tenderness. Right sinus exhibits no frontal sinus tenderness. Left sinus exhibits no frontal sinus tenderness.  Mouth/Throat: Oropharynx is clear and moist. No oropharyngeal exudate.  Eyes: Conjunctivae, EOM and lids are normal. Pupils are equal, round, and reactive to light. Right eye exhibits no discharge. Left eye exhibits no discharge. No scleral icterus.  Neck: Normal range of motion. Neck supple. No JVD present. No tracheal deviation present. No thyromegaly present.  Cardiovascular: Normal rate, regular rhythm, normal heart sounds and intact distal pulses.  Exam reveals no gallop and no friction rub.   No murmur heard. Pulmonary/Chest: Effort normal and breath sounds normal. No stridor. No respiratory distress. She has no wheezes.  She has no rales. She exhibits no tenderness.  Musculoskeletal: Normal range of motion.  Lymphadenopathy:    She has cervical adenopathy.  Neurological: She is alert and oriented to person, place, and time.  Skin: Skin is warm,  dry and intact. No rash noted. No erythema. No pallor.  Psychiatric: She has a normal mood and affect. Her speech is normal and behavior is normal. Judgment and thought content normal. Cognition and memory are normal.  Nursing note and vitals reviewed.   Results for orders placed or performed in visit on 04/19/15  Microscopic Examination  Result Value Ref Range   WBC, UA 0-5 0 -  5 /hpf   RBC, UA None seen 0 -  2 /hpf   Epithelial Cells (non renal) 0-10 0 - 10 /hpf   Mucus, UA Present Not Estab.   Bacteria, UA None seen None seen/Few  UA/M w/rflx Culture, Routine  Result Value Ref Range   Specific Gravity, UA 1.025 1.005 - 1.030   pH, UA 5.0 5.0 - 7.5   Color, UA Yellow Yellow   Appearance Ur Clear Clear   Leukocytes, UA Negative Negative   Protein, UA Trace Negative/Trace   Glucose, UA Negative Negative   Ketones, UA 1+ (A) Negative   RBC, UA 1+ (A) Negative   Bilirubin, UA Negative Negative   Urobilinogen, Ur 1.0 0.2 - 1.0 mg/dL   Nitrite, UA Positive (A) Negative   Microscopic Examination See below:    Urinalysis Reflex Comment   CBC With Differential/Platelet  Result Value Ref Range   WBC 11.0 (H) 3.4 - 10.8 x10E3/uL   RBC 4.35 3.77 - 5.28 x10E6/uL   Hemoglobin 14.5 11.1 - 15.9 g/dL   Hematocrit 16.1 09.6 - 46.6 %   MCV 94 79 - 97 fL   MCH 33.3 (H) 26.6 - 33.0 pg   MCHC 35.5 31.5 - 35.7 g/dL   RDW 04.5 (H) 40.9 - 81.1 %   Platelets 226 150 - 379 x10E3/uL   Neutrophils 75 %   Lymphs 16 %   MID 8 %   Neutrophils Absolute 8.3 (H) 1.4 - 7.0 x10E3/uL   Lymphocytes Absolute 1.8 0.7 - 3.1 x10E3/uL   MID (Absolute) 0.9 0.1 - 1.6 X10E3/uL  Comprehensive metabolic panel  Result Value Ref Range   Glucose 92 65 - 99 mg/dL   BUN 8 6 - 24 mg/dL   Creatinine, Ser 9.14 0.57 - 1.00 mg/dL   GFR calc non Af Amer 81 >59 mL/min/1.73   GFR calc Af Amer 94 >59 mL/min/1.73   BUN/Creatinine Ratio 10 9 - 23   Sodium 136 136 - 144 mmol/L   Potassium 4.1 3.5 - 5.2 mmol/L   Chloride 98  97 - 106 mmol/L   CO2 23 18 - 29 mmol/L   Calcium 8.8 8.7 - 10.2 mg/dL   Total Protein 6.3 6.0 - 8.5 g/dL   Albumin 3.8 3.5 - 5.5 g/dL   Globulin, Total 2.5 1.5 - 4.5 g/dL   Albumin/Globulin Ratio 1.5 1.1 - 2.5   Bilirubin Total 1.6 (H) 0.0 - 1.2 mg/dL   Alkaline Phosphatase 88 39 - 117 IU/L   AST 28 0 - 40 IU/L   ALT 26 0 - 32 IU/L  TSH  Result Value Ref Range   TSH 1.050 0.450 - 4.500 uIU/mL  Urine Culture, Routine  Result Value Ref Range   Urine Culture, Routine Final report    Urine Culture result 1 Comment  Assessment & Plan:   Problem List Items Addressed This Visit      Cardiovascular and Mediastinum   Hypertension    Strongly advised patient to restart her medicine and not to stop it when she has a cold. She will. Seeing PCP in 1 month, recheck BP at that time as she has been well controlled.        Other Visit Diagnoses    Acute maxillary sinusitis, recurrence not specified    -  Primary    Will treat with agumentin. Tussionex to help her sleep. Call if not getting better or getting worse.     Relevant Medications    chlorpheniramine-HYDROcodone (TUSSIONEX PENNKINETIC ER) 10-8 MG/5ML SUER    amoxicillin-clavulanate (AUGMENTIN) 875-125 MG tablet        Follow up plan: Return As scheduled.Marland Kitchen

## 2015-08-23 ENCOUNTER — Ambulatory Visit: Payer: No Typology Code available for payment source | Admitting: Family Medicine

## 2015-09-14 ENCOUNTER — Ambulatory Visit (INDEPENDENT_AMBULATORY_CARE_PROVIDER_SITE_OTHER): Payer: No Typology Code available for payment source | Admitting: Family Medicine

## 2015-09-14 ENCOUNTER — Encounter: Payer: Self-pay | Admitting: Family Medicine

## 2015-09-14 VITALS — BP 152/81 | HR 62 | Temp 97.5°F | Ht <= 58 in | Wt 165.0 lb

## 2015-09-14 DIAGNOSIS — N83202 Unspecified ovarian cyst, left side: Secondary | ICD-10-CM | POA: Diagnosis not present

## 2015-09-14 DIAGNOSIS — I10 Essential (primary) hypertension: Secondary | ICD-10-CM

## 2015-09-14 NOTE — Assessment & Plan Note (Signed)
Refer back to Dr. Valentino Saxonherry for evaluation; discussed seriousness

## 2015-09-14 NOTE — Progress Notes (Signed)
BP 152/81 mmHg  Pulse 62  Temp(Src) 97.5 F (36.4 C)  Ht 4' 9.5" (1.461 m)  Wt 165 lb (74.844 kg)  BMI 35.06 kg/m2  SpO2 100%  LMP 09/12/2015 (Exact Date)   Subjective:    Patient ID: Megan Bruce, female    DOB: 22-Apr-1961, 55 y.o.   MRN: [id number]  HPI: Megan Bruce is a 55 y.o. female  Chief Complaint  Patient presents with  . Hypertension    follow up   She has been taking advil for recent cold; sick and vomiting and has been in the bed; got sick on Monday morning at 4 am; nausea and vomiting is how it started; achy and taking advil; no ear problems; heartburn from throwing up  She had a sinus infection in January and quit taking BP medicine then, reviewed BP and it was 157/98 at that visit  High blood pressure; not checking BP away from doctor; she says she stopped taking her medicine (amlodipine) for several days; not much salt added to food; not checking labels for sodium  She had an US of the pelvis; found a cystic place and was referred to Dr. Valentino Saxon but patient never went  Insurance would not pay for abdomen US so she did not have that; no bloating, no abdominal pain; had a big menstrual period in November; back to regular periods  Relevant past medical, surgical, social history reviewed and updated as indicated Past Medical History  Diagnosis Date  . Allergy   . Hypertension   . Urge incontinence   . GERD (gastroesophageal reflux disease)    Past Surgical History  Procedure Laterality Date  . Toe surgery     Interim medical history since our last visit reviewed. Allergies and medications reviewed and updated.  Review of Systems Per HPI unless specifically indicated above     Objective:    BP 152/81 mmHg  Pulse 62  Temp(Src) 97.5 F (36.4 C)  Ht 4' 9.5" (1.461 m)  Wt 165 lb (74.844 kg)  BMI 35.06 kg/m2  SpO2 100%  LMP 09/12/2015 (Exact Date)  Wt Readings from Last 3 Encounters:  09/14/15 165 lb (74.844 kg)  07/20/15 166 lb (75.297 kg)   04/19/15 162 lb 12.8 oz (73.846 kg)    Today's Vitals   09/14/15 1525 09/14/15 1603  BP: 156/90 152/81  Pulse: 69 62  Temp: 97.5 F (36.4 C)   Height: 4' 9.5" (1.461 m)   Weight: 165 lb (74.844 kg)   SpO2: 100%     Physical Exam  Constitutional: She appears well-developed and well-nourished. No distress.  Eyes: EOM are normal. No scleral icterus.  Neck: No JVD present.  Cardiovascular: Normal rate and regular rhythm.   Pulmonary/Chest: Effort normal and breath sounds normal.  Abdominal: She exhibits no distension.  Musculoskeletal: She exhibits no edema.  Neurological: She is alert.  Skin: Skin is warm. She is not diaphoretic. No pallor.  Psychiatric: She has a normal mood and affect.      Assessment & Plan:   Problem List Items Addressed This Visit      Cardiovascular and Mediastinum   Hypertension - Primary    Not controlled; get back on medicine regularly; avoid decongestants, NSAIDs which can increase pressures; try to follow DASH guidelines; monitor BP and call me if not at goal prior to next appt; she agrees        Genitourinary   Ovarian cystic mass    Refer back to Dr. Valentino Saxon for evaluation; discussed seriousness  Relevant Orders   Ambulatory referral to Gynecology      Follow up plan: Return in about 6 months (around 03/16/2016) for hypertension.

## 2015-09-14 NOTE — Patient Instructions (Addendum)
Your goal blood pressure is less than 140 mmHg on top and bottom number under 90 Try to follow the DASH guidelines (DASH stands for Dietary Approaches to Stop Hypertension) Try to limit the sodium in your diet.  Ideally, consume less than 1.5 grams (less than 1,500mg ) per day. Do not add salt when cooking or at the table.  Check the sodium amount on labels when shopping, and choose items lower in sodium when given a choice. Avoid or limit foods that already contain a lot of sodium. Eat a diet rich in fruits and vegetables and whole grains.  If you need something for aches or pains, try to use Tylenol (acetaminphen) instead of non-steroidals (which include Aleve, ibuprofen, Advil, Motrin, and naproxen); non-steroidals can cause long-term kidney damage  Try to use PLAIN allergy or cold medicine without the decongestant Avoid: phenylephrine, phenylpropanolamine, and pseudoephredine   Please check your blood pressure a few times over the next month and just call me with the readings and we can decide if medicine is okay to stay or needs changing  Do see Dr. Valentino Saxonherry just as soon as you can to follow-up on the left ovary; make sure you keep that appointment; it is so important  DASH Eating Plan DASH stands for "Dietary Approaches to Stop Hypertension." The DASH eating plan is a healthy eating plan that has been shown to reduce high blood pressure (hypertension). Additional health benefits may include reducing the risk of type 2 diabetes mellitus, heart disease, and stroke. The DASH eating plan may also help with weight loss. WHAT DO I NEED TO KNOW ABOUT THE DASH EATING PLAN? For the DASH eating plan, you will follow these general guidelines:  Choose foods with a percent daily value for sodium of less than 5% (as listed on the food label).  Use salt-free seasonings or herbs instead of table salt or sea salt.  Check with your health care provider or pharmacist before using salt substitutes.  Eat  lower-sodium products, often labeled as "lower sodium" or "no salt added."  Eat fresh foods.  Eat more vegetables, fruits, and low-fat dairy products.  Choose whole grains. Look for the word "whole" as the first word in the ingredient list.  Choose fish and skinless chicken or [place] more often than red meat. Limit fish, poultry, and meat to 6 oz (170 g) each day.  Limit sweets, desserts, sugars, and sugary drinks.  Choose heart-healthy fats.  Limit cheese to 1 oz (28 g) per day.  Eat more home-cooked food and less restaurant, buffet, and fast food.  Limit fried foods.  Cook foods using methods other than frying.  Limit canned vegetables. If you do use them, rinse them well to decrease the sodium.  When eating at a restaurant, ask that your food be prepared with less salt, or no salt if possible. WHAT FOODS CAN I EAT? Seek help from a dietitian for individual calorie needs. Grains Whole grain or whole wheat bread. Brown rice. Whole grain or whole wheat pasta. Quinoa, bulgur, and whole grain cereals. Low-sodium cereals. Corn or whole wheat flour tortillas. Whole grain cornbread. Whole grain crackers. Low-sodium crackers. Vegetables Fresh or frozen vegetables (raw, steamed, roasted, or grilled). Low-sodium or reduced-sodium tomato and vegetable juices. Low-sodium or reduced-sodium tomato sauce and paste. Low-sodium or reduced-sodium canned vegetables.  Fruits All fresh, canned (in natural juice), or frozen fruits. Meat and Other Protein Products Ground beef (85% or leaner), grass-fed beef, or beef trimmed of fat. Skinless chicken or [place]. Ground  chicken or [place]. Pork trimmed of fat. All fish and seafood. Eggs. Dried beans, peas, or lentils. Unsalted nuts and seeds. Unsalted canned beans. Dairy Low-fat dairy products, such as skim or 1% milk, 2% or reduced-fat cheeses, low-fat ricotta or cottage cheese, or plain low-fat yogurt. Low-sodium or reduced-sodium cheeses. Fats and  Oils Tub margarines without trans fats. Light or reduced-fat mayonnaise and salad dressings (reduced sodium). Avocado. Safflower, olive, or canola oils. Natural peanut or almond butter. Other Unsalted popcorn and pretzels. The items listed above may not be a complete list of recommended foods or beverages. Contact your dietitian for more options. WHAT FOODS ARE NOT RECOMMENDED? Grains White bread. White pasta. White rice. Refined cornbread. Bagels and croissants. Crackers that contain trans fat. Vegetables Creamed or fried vegetables. Vegetables in a cheese sauce. Regular canned vegetables. Regular canned tomato sauce and paste. Regular tomato and vegetable juices. Fruits Dried fruits. Canned fruit in light or heavy syrup. Fruit juice. Meat and Other Protein Products Fatty cuts of meat. Ribs, chicken wings, bacon, sausage, bologna, salami, chitterlings, fatback, hot dogs, bratwurst, and packaged luncheon meats. Salted nuts and seeds. Canned beans with salt. Dairy Whole or 2% milk, cream, half-and-half, and cream cheese. Whole-fat or sweetened yogurt. Full-fat cheeses or blue cheese. Nondairy creamers and whipped toppings. Processed cheese, cheese spreads, or cheese curds. Condiments Onion and garlic salt, seasoned salt, table salt, and sea salt. Canned and packaged gravies. Worcestershire sauce. Tartar sauce. Barbecue sauce. Teriyaki sauce. Soy sauce, including reduced sodium. Steak sauce. Fish sauce. Oyster sauce. Cocktail sauce. Horseradish. Ketchup and mustard. Meat flavorings and tenderizers. Bouillon cubes. Hot sauce. Tabasco sauce. Marinades. Taco seasonings. Relishes. Fats and Oils Butter, stick margarine, lard, shortening, ghee, and bacon fat. Coconut, palm kernel, or palm oils. Regular salad dressings. Other Pickles and olives. Salted popcorn and pretzels. The items listed above may not be a complete list of foods and beverages to avoid. Contact your dietitian for more  information. WHERE CAN I FIND MORE INFORMATION? National Heart, Lung, and Blood Institute: [employer]   This information is not intended to replace advice given to you by your health care provider. Make sure you discuss any questions you have with your health care provider.   Document Released: 06/05/2011 Document Revised: 07/07/2014 Document Reviewed: 04/20/2013 Elsevier Interactive Patient Education [employer]

## 2015-09-20 ENCOUNTER — Telehealth: Payer: Self-pay | Admitting: Family Medicine

## 2015-09-20 NOTE — Telephone Encounter (Signed)
Pt called stated she has been monitoring her BP and it is down. Pt also stated she would need a refill on her BP medication. [facility] [place] Thanks.

## 2015-09-21 NOTE — Telephone Encounter (Signed)
Left message to call.

## 2015-09-24 NOTE — Telephone Encounter (Signed)
She said her bp is doing much better now. She will need a refill on the amlodipine in a couple months. I advised her to call us back after she fills the last refill.

## 2015-09-24 NOTE — Telephone Encounter (Signed)
Patient left message Friday afternoon after I left, stated the best time to call her is after 3pm  At [phone].

## 2015-09-27 NOTE — Assessment & Plan Note (Addendum)
Not controlled; get back on medicine regularly; avoid decongestants, NSAIDs which can increase pressures; try to follow DASH guidelines; monitor BP and call me if not at goal prior to next appt; she agrees

## 2015-10-16 ENCOUNTER — Encounter: Payer: Self-pay | Admitting: Obstetrics and Gynecology

## 2015-10-16 ENCOUNTER — Ambulatory Visit (INDEPENDENT_AMBULATORY_CARE_PROVIDER_SITE_OTHER): Payer: No Typology Code available for payment source | Admitting: Obstetrics and Gynecology

## 2015-10-16 VITALS — BP 151/80 | HR 62 | Ht 59.0 in | Wt 168.7 lb

## 2015-10-16 DIAGNOSIS — R194 Change in bowel habit: Secondary | ICD-10-CM | POA: Diagnosis not present

## 2015-10-16 DIAGNOSIS — N83202 Unspecified ovarian cyst, left side: Secondary | ICD-10-CM

## 2015-10-16 DIAGNOSIS — R140 Abdominal distension (gaseous): Secondary | ICD-10-CM | POA: Diagnosis not present

## 2015-10-16 NOTE — Progress Notes (Signed)
GYNECOLOGY PROGRESS NOTE  Subjective:    Patient ID: Megan Bruce, female    DOB: [birth year], 55 y.o.   MRN: [id number]  HPI  Patient is a 55 y.o. female who presents as a referral from [facility] for ovarian cyst and lower abdominal pain.  Patient noted bilateral (but left > right) abdominal pain approximately 6 months ago.  Pain was associated with feeling "gasey", and nauseated.  Denied vomiting at that time Ultrasound performed at that time noted a 4 cm cyst.  Patient also had a Nexplanon implant prior to this time, which she requested removal as she began noting heavier periods.  Notes that after Nexplanon was removed, her left sided pain resolved.   However, still noted complaint of abdominal bloating, alternating episodes of diarrhea and constipation, and intermittent feelings as though her stomach is "locked up in knots".  Was referred to GYN to follow up cyst.    Past Medical History  Diagnosis Date  . Allergy   . Hypertension   . Urge incontinence   . GERD (gastroesophageal reflux disease)     Family History  Problem Relation Age of Onset  . Stroke Mother   . Hypertension Mother   . Kidney disease Mother   . Congestive Heart Failure Mother   . Heart disease Mother     CHF  . Stroke Father   . Hypertension Father   . Heart disease Maternal Grandmother   . Cancer Neg Hx   . COPD Neg Hx   . Diabetes Neg Hx     Past Surgical History  Procedure Laterality Date  . Toe surgery      Social History   Social History  . Marital Status: Married    Spouse Name: N/A  . Number of Children: N/A  . Years of Education: N/A   Occupational History  . Not on file.   Social History Main Topics  . Smoking status: Never Smoker   . Smokeless tobacco: Never Used  . Alcohol Use: No  . Drug Use: No  . Sexual Activity: Yes   Other Topics Concern  . Not on file   Social History Narrative    Current Outpatient Prescriptions on File Prior to Visit    Medication Sig Dispense Refill  . amLODipine (NORVASC) 5 MG tablet Take 1 tablet (5 mg total) by mouth daily. 30 tablet 6  . ketotifen (ZADITOR) 0.025 % ophthalmic solution INT 1 GTT IN OU BID  11   No current facility-administered medications on file prior to visit.    Allergies  Allergen Reactions  . Codeine     Tablet form      Review of Systems Pertinent items noted in HPI and remainder of comprehensive ROS otherwise negative.   Objective:   Blood pressure 151/80, pulse 62, height 4\' 11"  (1.499 m), weight 168 lb 11.2 oz (76.522 kg), last menstrual period 09/15/2015. General appearance: alert and no distress Abdomen: soft, non-tender; bowel sounds normal; no masses,  no organomegaly Pelvic: cervix normal in appearance, external genitalia normal, no adnexal masses or tenderness, no cervical motion tenderness, rectovaginal septum normal, uterus normal size, shape, and consistency and vagina normal without discharge Extremities: extremities normal, atraumatic, no cyanosis or edema Neurologic: Grossly normal    Imaging (Pelvic Ultrasound 05/01/2015):  FINDINGS: Uterus  Measurements: 9.1 x 4.7 x 5.3 cm. Heterogeneous echotexture with no discrete fibroids or other parenchymal abnormality.  Endometrium  Thickness: 9.2 mm. No focal abnormality visualized.  Right ovary  Measurements: 3.6 x 1.6 x 2.1 cm. Normal appearance/no adnexal mass.  Left ovary  Measurements: 5.4 x 3.5 x 4.4 cm. There is a left ovarian cystic structure exhibiting incomplete septations measuring 4 x 3.1 x 3.4 cm.  Other findings  No free fluid.  IMPRESSION: 1. 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 The maximal dimension is 4 cm. Follow-up ultrasound in 6-12 weeks is recommended to reassess this structure. 2. The uterus and right ovary exhibit no acute abnormalities. There is no free pelvic fluid.   Assessment:   H/o left ovarian cyst Abdominal  bloating Alternating episodes diarrhea and constipation  Plan:   Recommend repeat ultrasound to f/u with ovarian cyst.  Patient denies any further left sided pain.  Discussed that most cysts are benign and usually self resolve.  If no resolution, can initiate short trial of progesterone OCPs and repeat ultrasound after completion of OCPs. Will notify patient of results by phone.  Patient advised that current symptoms are more consistent with possible IBS diagnosis.  Recommend f/u with PCP who can refer to GI for further investigation.    Hildred Laser, MD [facility]

## 2015-10-25 ENCOUNTER — Other Ambulatory Visit: Payer: No Typology Code available for payment source

## 2015-11-01 ENCOUNTER — Other Ambulatory Visit: Payer: No Typology Code available for payment source

## 2015-11-06 ENCOUNTER — Ambulatory Visit: Payer: No Typology Code available for payment source | Admitting: Obstetrics and Gynecology

## 2016-02-08 ENCOUNTER — Telehealth: Payer: Self-pay | Admitting: Family Medicine

## 2016-02-08 MED ORDER — KETOTIFEN FUMARATE 0.025 % OP SOLN: 1.0000 [drp] | Freq: Two times a day (BID) | OPHTHALMIC | 11 refills | Status: DC

## 2016-02-08 NOTE — Telephone Encounter (Signed)
done

## 2016-03-04 ENCOUNTER — Ambulatory Visit: Payer: No Typology Code available for payment source | Admitting: Family Medicine

## 2016-07-18 ENCOUNTER — Telehealth: Payer: Self-pay | Admitting: Family Medicine

## 2016-07-18 ENCOUNTER — Encounter: Payer: Self-pay | Admitting: Family Medicine

## 2016-07-18 ENCOUNTER — Ambulatory Visit (INDEPENDENT_AMBULATORY_CARE_PROVIDER_SITE_OTHER): Payer: Managed Care, Other (non HMO) | Admitting: Family Medicine

## 2016-07-18 VITALS — BP 124/82 | HR 69 | Temp 97.9°F | Resp 14 | Wt 164.2 lb

## 2016-07-18 DIAGNOSIS — J0101 Acute recurrent maxillary sinusitis: Secondary | ICD-10-CM | POA: Diagnosis not present

## 2016-07-18 DIAGNOSIS — J343 Hypertrophy of nasal turbinates: Secondary | ICD-10-CM

## 2016-07-18 DIAGNOSIS — I10 Essential (primary) hypertension: Secondary | ICD-10-CM

## 2016-07-18 MED ORDER — PREDNISONE 20 MG PO TABS: 40.0000 mg | ORAL_TABLET | Freq: Every day | ORAL | 0 refills | Status: AC

## 2016-07-18 MED ORDER — AMOXICILLIN-POT CLAVULANATE 875-125 MG PO TABS: 1.0000 | ORAL_TABLET | Freq: Two times a day (BID) | ORAL | 0 refills | Status: AC

## 2016-07-18 NOTE — Patient Instructions (Addendum)
Okay to use Afrin for no more than 3 days Your goal blood pressure is less than 140 mmHg on top. Try to follow the DASH guidelines (DASH stands for Dietary Approaches to Stop Hypertension) Try to limit the sodium in your diet.  Ideally, consume less than 1.5 grams (less than 1,500mg ) per day. Do not add salt when cooking or at the table.  Check the sodium amount on labels when shopping, and choose items lower in sodium when given a choice. Avoid or limit foods that already contain a lot of sodium. Eat a diet rich in fruits and vegetables and whole grains. Start the antibiotics and the prednisone Please do eat yogurt daily or take a probiotic daily for the next month or two We want to replace the healthy germs in the gut If you notice foul, watery diarrhea in the next two months, schedule an appointment RIGHT AWAY

## 2016-07-18 NOTE — Progress Notes (Signed)
BP 124/82 (BP Location: Left Arm, Patient Position: Sitting, Cuff Size: Normal)   Pulse 69   Temp 97.9 F (36.6 C)   Resp 14   Wt 164 lb 4 oz (74.5 kg)   LMP 07/07/2016   SpO2 98%   BMI 33.17 kg/m    Subjective:    Patient ID: Megan Bruce, female    DOB: Dec 07, 1960, 56 y.o.   MRN: [id number]  HPI: Megan Bruce is a 56 y.o. female  Chief Complaint  Patient presents with  . Sinusitis    sinus pressure under eyes in the nose; happen tuesday night    Symptoms started on Tuesday Her teeth hurt, lots of pressure Blowing out clear stuff Feels yucky and stick Has not used any nose spray until last night, but had to last night (Afrin) Ears are having a little pressure No travel No real sore throat; little bit of cough from drainage  She has been off of her BP medicine for a while; staying controlled without pills; would like a trial off  Depression screen [facility] 2/9 07/18/2016  Decreased Interest 0  Down, Depressed, Hopeless 0  PHQ - 2 Score 0   Relevant past medical, surgical, family and social history reviewed Past Medical History:  Diagnosis Date  . Allergy   . GERD (gastroesophageal reflux disease)   . Hypertension   . Urge incontinence    Past Surgical History:  Procedure Laterality Date  . TOE SURGERY     Family History  Problem Relation Age of Onset  . Stroke Mother   . Hypertension Mother   . Kidney disease Mother   . Congestive Heart Failure Mother   . Heart disease Mother     CHF  . Stroke Father   . Hypertension Father   . Heart disease Maternal Grandmother   . Cancer Neg Hx   . COPD Neg Hx   . Diabetes Neg Hx    Social History  Substance Use Topics  . Smoking status: Never Smoker  . Smokeless tobacco: Never Used  . Alcohol use No    Interim medical history since last visit reviewed. Allergies and medications reviewed  Review of Systems Per HPI unless specifically indicated above     Objective:    BP 124/82 (BP Location: Left Arm,  Patient Position: Sitting, Cuff Size: Normal)   Pulse 69   Temp 97.9 F (36.6 C)   Resp 14   Wt 164 lb 4 oz (74.5 kg)   LMP 07/07/2016   SpO2 98%   BMI 33.17 kg/m   Wt Readings from Last 3 Encounters:  07/18/16 164 lb 4 oz (74.5 kg)  10/16/15 168 lb 11.2 oz (76.5 kg)  09/14/15 165 lb (74.8 kg)    Physical Exam  Constitutional: She appears well-developed and well-nourished.  HENT:  Right Ear: Hearing, tympanic membrane, external ear and ear canal normal.  Left Ear: Hearing, tympanic membrane, external ear and ear canal normal.  Nose: Mucosal edema and rhinorrhea present. Right sinus exhibits maxillary sinus tenderness. Left sinus exhibits maxillary sinus tenderness.  Mouth/Throat: Oropharynx is clear and moist and mucous membranes are normal. No posterior oropharyngeal edema or posterior oropharyngeal erythema.  Eyes: EOM are normal. No scleral icterus.  Cardiovascular: Normal rate and regular rhythm.   Pulmonary/Chest: Effort normal and breath sounds normal.  Skin: She is not diaphoretic. No pallor.  Psychiatric: She has a normal mood and affect. Her behavior is normal.      Assessment & Plan:  Problem List Items Addressed This Visit      Cardiovascular and Mediastinum   Hypertension    Controlled currently without CCB; she wishes to stop, keep an eye on pressures off of medicine; okay with me; see AVS; try DASH guidelines; can restart if pressures go back up       Other Visit Diagnoses    Acute recurrent maxillary sinusitis    -  Primary   use Afrin for no more than 3 days; start antibiotics; see AVS; C diff precautions reviewed; rest; prednisone for marked nasal turb hypertrophy   Relevant Medications   amoxicillin-clavulanate (AUGMENTIN) 875-125 MG tablet   predniSONE (DELTASONE) 20 MG tablet   Nasal turbinate hypertrophy       five day course of prednisone       Follow up plan: No Follow-up on file.  An after-visit summary was printed and given to the patient  at check-out.  Please see the patient instructions which may contain other information and recommendations beyond what is mentioned above in the assessment and plan.  Meds ordered this encounter  Medications  . amoxicillin-clavulanate (AUGMENTIN) 875-125 MG tablet    Sig: Take 1 tablet by mouth 2 (two) times daily.    Dispense:  20 tablet    Refill:  0  . predniSONE (DELTASONE) 20 MG tablet    Sig: Take 2 tablets (40 mg total) by mouth daily with breakfast.    Dispense:  10 tablet    Refill:  0    No orders of the defined types were placed in this encounter.

## 2016-07-18 NOTE — Telephone Encounter (Signed)
We'll suggest a MyChart visit I won't be able to do this without evaluation Thank you

## 2016-07-18 NOTE — Telephone Encounter (Signed)
Pt has congestion and sinus infection. She is asking that you send antibotic to [facility]. It began 2 days ago. No fever no body ache. Pain in eye area and nose.

## 2016-07-18 NOTE — Assessment & Plan Note (Signed)
Controlled currently without CCB; she wishes to stop, keep an eye on pressures off of medicine; okay with me; see AVS; try DASH guidelines; can restart if pressures go back up

## 2016-08-28 ENCOUNTER — Ambulatory Visit (INDEPENDENT_AMBULATORY_CARE_PROVIDER_SITE_OTHER): Payer: Managed Care, Other (non HMO) | Admitting: Family Medicine

## 2016-08-28 ENCOUNTER — Encounter: Payer: Self-pay | Admitting: Family Medicine

## 2016-08-28 DIAGNOSIS — N83202 Unspecified ovarian cyst, left side: Secondary | ICD-10-CM | POA: Diagnosis not present

## 2016-08-28 DIAGNOSIS — Z0000 Encounter for general adult medical examination without abnormal findings: Secondary | ICD-10-CM | POA: Insufficient documentation

## 2016-08-28 DIAGNOSIS — I10 Essential (primary) hypertension: Secondary | ICD-10-CM

## 2016-08-28 DIAGNOSIS — K219 Gastro-esophageal reflux disease without esophagitis: Secondary | ICD-10-CM

## 2016-08-28 DIAGNOSIS — K5900 Constipation, unspecified: Secondary | ICD-10-CM | POA: Insufficient documentation

## 2016-08-28 LAB — COMPLETE METABOLIC PANEL WITH GFR
ALBUMIN: 3.8 g/dL (ref 3.6–5.1)
ALT: 14 U/L (ref 6–29)
AST: 17 U/L (ref 10–35)
Alkaline Phosphatase: 60 U/L (ref 33–130)
BUN: 8 mg/dL (ref 7–25)
CHLORIDE: 106 mmol/L (ref 98–110)
CO2: 26 mmol/L (ref 20–31)
Calcium: 8.9 mg/dL (ref 8.6–10.4)
Creat: 0.76 mg/dL (ref 0.50–1.05)
GFR, Est African American: >89 mL/min (ref >=60)
GFR, Est Non African American: >89 mL/min (ref >=60)
GLUCOSE: 75 mg/dL (ref 65–99)
POTASSIUM: 3.7 mmol/L (ref 3.5–5.3)
SODIUM: 141 mmol/L (ref 135–146)
Total Bilirubin: 0.7 mg/dL (ref 0.2–1.2)
Total Protein: 6.5 g/dL (ref 6.1–8.1)

## 2016-08-28 LAB — CBC WITH DIFFERENTIAL/PLATELET
BASOS PCT: 0 %
Basophils Absolute: 0 cells/uL (ref 0–200)
EOS PCT: 3 %
Eosinophils Absolute: 183 cells/uL (ref 15–500)
HCT: 45.7 % — ABNORMAL HIGH (ref 35.0–45.0)
Hemoglobin: 15.4 g/dL (ref 11.7–15.5)
LYMPHS PCT: 42 %
Lymphs Abs: 2562 cells/uL (ref 850–3900)
MCH: 31.5 pg (ref 27.0–33.0)
MCHC: 33.7 g/dL (ref 32.0–36.0)
MCV: 93.5 fL (ref 80.0–100.0)
MPV: 9.8 fL (ref 7.5–12.5)
Monocytes Absolute: 366 cells/uL (ref 200–950)
Monocytes Relative: 6 %
NEUTROS ABS: 2989 {cells}/uL (ref 1500–7800)
Neutrophils Relative %: 49 %
PLATELETS: 280 10*3/uL (ref 140–400)
RBC: 4.89 MIL/uL (ref 3.80–5.10)
RDW: 13.7 % (ref 11.0–15.0)
WBC: 6.1 10*3/uL (ref 3.8–10.8)

## 2016-08-28 LAB — LIPID PANEL
CHOL/HDL RATIO: 3.1 ratio (ref <5.0)
Cholesterol: 157 mg/dL (ref <200)
HDL: 51 mg/dL (ref >50)
LDL CALC: 71 mg/dL (ref <100)
Triglycerides: 177 mg/dL — ABNORMAL HIGH (ref <150)
VLDL: 35 mg/dL — AB (ref <30)

## 2016-08-28 LAB — TSH: TSH: 3.12 m[IU]/L

## 2016-08-28 MED ORDER — LUBIPROSTONE 8 MCG PO CAPS: 8.0000 ug | ORAL_CAPSULE | Freq: Two times a day (BID) | ORAL | 11 refills | Status: DC

## 2016-08-28 NOTE — Patient Instructions (Addendum)
Please do call Dr. Valentino Saxonherry about the ovarian cyst to see if you still need to follow-up on that Try to get 25 grams of fiber a day Drink 64 ounces of water a day Start the new medicine Keep me posted on your symptoms Return soon for a complete physical

## 2016-08-28 NOTE — Assessment & Plan Note (Signed)
Controlled on nothing

## 2016-08-28 NOTE — Assessment & Plan Note (Signed)
Avoid triggers 

## 2016-08-28 NOTE — Progress Notes (Signed)
BP 126/80   Pulse 72   Temp 98.1 F (36.7 C) (Oral)   Resp 14   Wt 164 lb 11.2 oz (74.7 kg)   LMP 08/18/2016   SpO2 97%   BMI 33.27 kg/m    Subjective:    Patient ID: Megan Bruce, female    DOB: 09-Feb-1961, 56 y.o.   MRN: [id number]  HPI: Megan Bruce is a 56 y.o. female  Chief Complaint  Patient presents with  . Abdominal Pain    constipation   Patient is here for an acute visit She has constipation Gets constipation first, then blows up and then releases She has blow outs at time like blow outs like diarrhea Sometimes a bunch of stool comes out No certain foods will trigger it No blood or mucous No known IBS in the family Stress can make her symptoms worse Worried about going to eat Sister-in-law has Crohn's and she said it sounds like IBS too Drinking a lot of apple juice to help keep her going to bathroom [employer] Florastor which seemed to help at first  Has GERD, sinus drainage seems to be a trigger  She had a complex ovarian cyst and was seen Dr. Valentino Saxonherry She went to her and had one US and was supposed to have another US; patient says Dr. Valentino Saxonherry saw her that day that she was supposed to have the US and said she didn't see it  HTN; no meds at this time; lost four pounds  Last pap smear UTD; April 2016; had implanon removed Colonoscopy overdue, pt will consider Does not want flu shot Will get mammogram in April 2018 Not interested in hiv or hep C  [facility] 08/28/2016 07/18/2016  Decreased Interest 0 0  Down, Depressed, Hopeless 0 0  PHQ - 2 Score 0 0   Relevant past medical, surgical, family and social history reviewed  Past Medical History:  Diagnosis Date  . Allergy   . GERD (gastroesophageal reflux disease)   . Hypertension   . Urge incontinence    Past Surgical History:  Procedure Laterality Date  . TOE SURGERY     Family History  Problem Relation Age of Onset  . Stroke Mother   . Hypertension Mother   . Kidney disease  Mother   . Congestive Heart Failure Mother   . Heart disease Mother     CHF  . Stroke Father   . Hypertension Father   . Heart disease Maternal Grandmother   . Cancer Neg Hx   . COPD Neg Hx   . Diabetes Neg Hx    Social History  Substance Use Topics  . Smoking status: Never Smoker  . Smokeless tobacco: Never Used  . Alcohol use No   Interim medical history since last visit reviewed. Allergies and medications reviewed  Review of Systems Per HPI unless specifically indicated above     Objective:    BP 126/80   Pulse 72   Temp 98.1 F (36.7 C) (Oral)   Resp 14   Wt 164 lb 11.2 oz (74.7 kg)   LMP 08/18/2016   SpO2 97%   BMI 33.27 kg/m   Wt Readings from Last 3 Encounters:  08/28/16 164 lb 11.2 oz (74.7 kg)  07/18/16 164 lb 4 oz (74.5 kg)  10/16/15 168 lb 11.2 oz (76.5 kg)    Physical Exam  Constitutional: She appears well-developed and well-nourished. No distress.  Eyes: EOM are normal. No scleral icterus.  Neck: No JVD present.  Cardiovascular: Normal rate and regular rhythm.   Pulmonary/Chest: Effort normal and breath sounds normal.  Abdominal: Soft. Bowel sounds are normal. She exhibits no distension and no mass. There is no tenderness.  Musculoskeletal: She exhibits no edema.  Neurological: She is alert.  Skin: Skin is warm. She is not diaphoretic. No pallor.  Psychiatric: She has a normal mood and affect. Her mood appears not anxious. She does not exhibit a depressed mood.      Assessment & Plan:   Problem List Items Addressed This Visit      Cardiovascular and Mediastinum   Hypertension    Controlled on nothing        Digestive   GERD (gastroesophageal reflux disease)    Avoid triggers      Relevant Medications   lubiprostone (AMITIZA) 8 MCG capsule   Constipation    Start med; fiber 25 grams or more a day; 64 ounces of water a day; check TSH        Genitourinary   Ovarian cystic mass    Patient asked to please f/u with Dr. Valentino Saxon to  make sure this has resolved        Other   Preventative health care    See above      Relevant Orders   Lipid panel   CBC with Differential/Platelet   TSH   COMPLETE METABOLIC PANEL WITH GFR      Follow up plan: No Follow-up on file.  An after-visit summary was printed and given to the patient at check-out.  Please see the patient instructions which may contain other information and recommendations beyond what is mentioned above in the assessment and plan.  Meds ordered this encounter  Medications  . lubiprostone (AMITIZA) 8 MCG capsule    Sig: Take 1 capsule (8 mcg total) by mouth 2 (two) times daily with a meal.    Dispense:  60 capsule    Refill:  11    Orders Placed This Encounter  Procedures  . Lipid panel  . CBC with Differential/Platelet  . TSH  . COMPLETE METABOLIC PANEL WITH GFR

## 2016-08-28 NOTE — Assessment & Plan Note (Signed)
Start med; fiber 25 grams or more a day; 64 ounces of water a day; check TSH

## 2016-08-28 NOTE — Assessment & Plan Note (Signed)
Patient asked to please f/u with Dr. Valentino Saxonherry to make sure this has resolved

## 2016-08-28 NOTE — Assessment & Plan Note (Signed)
See above

## 2016-08-29 ENCOUNTER — Telehealth: Payer: Self-pay

## 2016-08-29 MED ORDER — LINACLOTIDE 145 MCG PO CAPS: 145.0000 ug | ORAL_CAPSULE | Freq: Every day | ORAL | 11 refills | Status: DC

## 2016-08-29 NOTE — Telephone Encounter (Signed)
Slimness is to expensive. It will cost pt $ 311. Please Megan Bruce

## 2016-08-29 NOTE — Telephone Encounter (Signed)
Pt called asking for her lab results. I saw that you already reviewed it therefore went over them with her. Also, her insurance does not cover Amitiza therefore on your desk her pharmacy has sent you a notice about that.

## 2016-08-29 NOTE — Telephone Encounter (Signed)
I think she meant "Linzess" (see med list) Please see if we have a savings card That's the one her insurance company will cover Thank you

## 2016-08-29 NOTE — Telephone Encounter (Signed)
Labs are all reassuring I've sent in Linzess instead of the Amitiza Thank you

## 2016-08-29 NOTE — Telephone Encounter (Signed)
Pt.notified

## 2016-08-29 NOTE — Telephone Encounter (Signed)
I found a discount card for Linzess. I will call the pt and let her know.

## 2016-09-01 ENCOUNTER — Telehealth: Payer: Self-pay

## 2016-09-01 NOTE — Telephone Encounter (Signed)
We'll ask her to do the leg work, then, and contact her insurance company to see what she can afford that is covered on her plan; ask her to call us back with name of medicine and we'll consider prescribing it; thank you

## 2016-09-01 NOTE — Telephone Encounter (Signed)
Patient called states the linzess with the coupon is still to expensive for her.

## 2016-09-01 NOTE — Telephone Encounter (Signed)
Patient notified, she will check with ins.

## 2017-01-01 ENCOUNTER — Encounter: Payer: Self-pay | Admitting: Obstetrics and Gynecology

## 2017-01-01 ENCOUNTER — Ambulatory Visit (INDEPENDENT_AMBULATORY_CARE_PROVIDER_SITE_OTHER): Payer: Managed Care, Other (non HMO) | Admitting: Obstetrics and Gynecology

## 2017-01-01 VITALS — BP 164/84 | HR 59 | Ht 59.0 in | Wt 164.5 lb

## 2017-01-01 DIAGNOSIS — N951 Menopausal and female climacteric states: Secondary | ICD-10-CM

## 2017-01-01 MED ORDER — NORETHINDRONE ACETATE 5 MG PO TABS: 5.0000 mg | ORAL_TABLET | Freq: Every day | ORAL | 2 refills | Status: DC

## 2017-01-01 NOTE — Progress Notes (Signed)
GYNECOLOGY PROGRESS NOTE  Subjective:    Patient ID: Megan Bruce, female    DOB: October 05, 1960, 56 y.o.   MRN: [id number]  HPI  Patient is a 56 y.o. P37 female who presents for complaints of cyclic abdominal bloating and bilateral pelvic pain.  Patient notes that her menstrual cycles are also becoming more irregular (occasionally skipping months).  Patient states that her symptoms occur on months that her period does not come.  Is worried as she has had a h/o ovarian cysts in the past, and wonders if she could have another. Patient also states that she notes moodiness throughout her cycle.  Patient's last menstrual period was 11/04/2016.   The following portions of the patient's history were reviewed and updated as appropriate: allergies, current medications, past family history, past medical history, past social history, past surgical history and problem list.  Review of Systems Pertinent items noted in HPI and remainder of comprehensive ROS otherwise negative.   Objective:   Blood pressure (!) 164/84, pulse (!) 59, height 4\' 11"  (1.499 m), weight 164 lb 8 oz (74.6 kg), last menstrual period 11/04/2016. General appearance: alert and no distress Abdomen: soft, non-tender; bowel sounds normal; no masses,  no organomegaly Pelvic: deferred   Assessment:   Perimenopausal symptoms  Plan:   Discussion had with patient regarding perimenopausal state (with irregular menses), and "PMS" symptoms. Discussed management options, including Pamprin/Midol, as well as progesterone supplementation for regulation of hormones and symptoms.  Patient would like to try the progesterone. Prescribed Aygestin 5 mg. Will recheck symptoms in 2-3 months. Will also f/u for annual exam at that time.    Hildred Laser, MD [facility]

## 2017-03-04 ENCOUNTER — Other Ambulatory Visit: Payer: Self-pay | Admitting: Obstetrics and Gynecology

## 2017-03-04 ENCOUNTER — Encounter: Payer: Self-pay | Admitting: Obstetrics and Gynecology

## 2017-03-04 ENCOUNTER — Ambulatory Visit (INDEPENDENT_AMBULATORY_CARE_PROVIDER_SITE_OTHER): Payer: 59 | Admitting: Obstetrics and Gynecology

## 2017-03-04 VITALS — BP 170/78 | HR 65 | Ht 59.0 in | Wt 168.8 lb

## 2017-03-04 DIAGNOSIS — N814 Uterovaginal prolapse, unspecified: Secondary | ICD-10-CM | POA: Diagnosis not present

## 2017-03-04 DIAGNOSIS — E669 Obesity, unspecified: Secondary | ICD-10-CM | POA: Diagnosis not present

## 2017-03-04 DIAGNOSIS — N951 Menopausal and female climacteric states: Secondary | ICD-10-CM

## 2017-03-04 DIAGNOSIS — Z1239 Encounter for other screening for malignant neoplasm of breast: Secondary | ICD-10-CM

## 2017-03-04 DIAGNOSIS — Z01419 Encounter for gynecological examination (general) (routine) without abnormal findings: Secondary | ICD-10-CM | POA: Diagnosis not present

## 2017-03-04 DIAGNOSIS — N852 Hypertrophy of uterus: Secondary | ICD-10-CM | POA: Diagnosis not present

## 2017-03-04 DIAGNOSIS — Z124 Encounter for screening for malignant neoplasm of cervix: Secondary | ICD-10-CM

## 2017-03-04 DIAGNOSIS — Z1231 Encounter for screening mammogram for malignant neoplasm of breast: Secondary | ICD-10-CM

## 2017-03-04 MED ORDER — NORETHINDRONE ACETATE 5 MG PO TABS: 5.0000 mg | ORAL_TABLET | Freq: Every day | ORAL | 2 refills | Status: DC

## 2017-03-04 NOTE — Progress Notes (Signed)
GYNECOLOGY ANNUAL PHYSICAL EXAM PROGRESS NOTE  Subjective:    Megan Bruce is a 56 y.o. G2P0 female who presents for an annual exam.  The patient is sexually active.  The patient wears seatbelts: yes. The patient participates in regular exercise: no. Has the patient ever been transfused or tattooed?: no. The patient reports that there is not domestic violence in her life.   Also is following up on current management of perimenoapusal symptoms. Patient was seen 2 months ago with complaints of abdominal bloating and pelvic pain, associated with occasional skipped cycles, and moodiness.  Was initiated on Aygestin last visit.  States that the first month her symptoms were completely resolved.  During the second month, the symptoms returned, however were much less bothersome.  Her period was a little heavier this last cycle.   Gynecologic History Menarche age: [age] Patient's last menstrual period was 02/20/2017. Contraception: none History of STI's: Denies Last Pap: ~2-3 years ago . Results were: normal.  Denies h/o abnormal pap smears. Last mammogram: 2018. Results were: normal   Obstetric History   G2   P0   T0   P0   A0   L2    SAB0   TAB0   Ectopic0   Multiple0   Live Births2     # Outcome Date GA Lbr Len/2nd Weight Sex Delivery Anes PTL Lv  2 Gravida           1 [place]Gravida               Past Medical History:  Diagnosis Date  . Allergy   . GERD (gastroesophageal reflux disease)   . Hypertension   . Urge incontinence     Past Surgical History:  Procedure Laterality Date  . TOE SURGERY      Family History  Problem Relation Age of Onset  . Stroke Mother   . Hypertension Mother   . Kidney disease Mother   . Congestive Heart Failure Mother   . Heart disease Mother        CHF  . Stroke Father   . Hypertension Father   . Heart disease Maternal Grandmother   . Cancer Neg Hx   . COPD Neg Hx   . Diabetes Neg Hx     Social History   Social History  . Marital status:  Married    Spouse name: N/A  . Number of children: N/A  . Years of education: N/A   Occupational History  . Not on file.   Social History Main Topics  . Smoking status: Never Smoker  . Smokeless tobacco: Never Used  . Alcohol use No  . Drug use: No  . Sexual activity: Yes    Birth control/ protection: None   Other Topics Concern  . Not on file   Social History Narrative  . No narrative on file    Current Outpatient Prescriptions on File Prior to Visit  Medication Sig Dispense Refill  . norethindrone (AYGESTIN) 5 MG tablet Take 1 tablet (5 mg total) by mouth daily. 30 tablet 2   No current facility-administered medications on file prior to visit.     Allergies  Allergen Reactions  . Codeine     Tablet form     Review of Systems Constitutional: negative for chills, fatigue, fevers and sweats Eyes: negative for irritation, redness and visual disturbance Ears, nose, mouth, throat, and face: negative for hearing loss, nasal congestion, snoring and tinnitus Respiratory: negative for asthma, cough, sputum Cardiovascular:  negative for chest pain, dyspnea, exertional chest pressure/discomfort, irregular heart beat, palpitations and syncope Gastrointestinal: negative for abdominal pain, change in bowel habits, nausea and vomiting. Positive for abdominal bloating Genitourinary: positive for skipped menstrual periods (see HPI) and intermittent pelvic pain, genital lesions, sexual problems and vaginal discharge, dysuria and urinary incontinence Integument/breast: negative for breast lump, breast tenderness and nipple discharge Hematologic/lymphatic: negative for bleeding and easy bruising Musculoskeletal:negative for back pain and muscle weakness Neurological: negative for dizziness, headaches, vertigo and weakness Endocrine: negative for diabetic symptoms including polydipsia, polyuria and skin dryness Allergic/Immunologic: negative for hay fever and urticaria         Objective:  Blood pressure (!) 170/78, pulse 65, height 4\' 11"  (1.499 m), weight 168 lb 12.8 oz (76.6 kg), last menstrual period 02/20/2017. Body mass index is 34.09 kg/m.  General Appearance:    Alert, cooperative, no distress, appears stated age, mildly obese  Head:    Normocephalic, without obvious abnormality, atraumatic  Eyes:    PERRL, conjunctiva/corneas clear, EOM's intact, both eyes  Ears:    Normal external ear canals, both ears  Nose:   Nares normal, septum midline, mucosa normal, no drainage or sinus tenderness  Throat:   Lips, mucosa, and tongue normal; teeth and gums normal  Neck:   Supple, symmetrical, trachea midline, no adenopathy; thyroid: no enlargement/tenderness/nodules; no carotid bruit or JVD  Back:     Symmetric, no curvature, ROM normal, no CVA tenderness  Lungs:     Clear to auscultation bilaterally, respirations unlabored  Chest Wall:    No tenderness or deformity   Heart:    Regular rate and rhythm, S1 and S2 normal, no murmur, rub or gallop  Breast Exam:    No tenderness, masses, or nipple abnormality  Abdomen:     Soft, non-tender, bowel sounds active all four quadrants, no masses, no organomegaly.    Genitalia:    Pelvic:external genitalia normal, vagina without lesions, discharge, or tenderness, rectovaginal septum  normal. Cervix normal in appearance, no cervical motion tenderness, no adnexal masses or tenderness.  Uterus enlarged, ~ 14 week size with some descensus noted (Grade II prolapse, asymptomatic), but normal shape, mobile, regular contours, nontender.  Rectal:    Normal external sphincter.  No hemorrhoids appreciated. Internal exam not done.   Extremities:   Extremities normal, atraumatic, no cyanosis or edema  Pulses:   2+ and symmetric all extremities  Skin:   Skin color, texture, turgor normal, no rashes or lesions  Lymph nodes:   Cervical, supraclavicular, and axillary nodes normal  Neurologic:   CNII-XII intact, normal strength, sensation and  reflexes throughout    Labs:  Lab Results  Component Value Date   WBC 6.1 08/28/2016   HGB 15.4 08/28/2016   HCT 45.7 (H) 08/28/2016   MCV 93.5 08/28/2016   PLT 280 08/28/2016    Lab Results  Component Value Date   CREATININE 0.76 08/28/2016   BUN 8 08/28/2016   NA 141 08/28/2016   K 3.7 08/28/2016   CL 106 08/28/2016   CO2 26 08/28/2016    Lab Results  Component Value Date   ALT 14 08/28/2016   AST 17 08/28/2016   ALKPHOS 60 08/28/2016   BILITOT 0.7 08/28/2016    Lab Results  Component Value Date   TSH 3.12 08/28/2016     Assessment:   Healthy female exam.  Perimenopausal symptoms Uterine enlargement Uterine descensus  Mild obesity Hypertension (uncontrolled)  Plan:     Blood tests: Labs up  to date. Breast self exam technique reviewed and patient encouraged to perform self-exam monthly. Contraception: none. Discussed healthy lifestyle modifications. Mammogram. Pap smear.  Discussed colon cancer screening and different methods of screening.  Patient desires more info on serologic screening Has no other risk factors for colon cancer.  EpiProColon handout given. Declines other methods of testing.  Refill given on Aygestin for postmenopausal symptoms.  Will transition to Micronor daily after 2-3 months to maintain resolution of symptoms.  Uterine descensus, asymptomatic at this time.  HTN, uncontrolled.  Managed by PCP. Encouraged to f/u.  Follow up in 1 year, or as needed.    Hildred Laser, MD [facility]

## 2017-03-04 NOTE — Patient Instructions (Addendum)

## 2017-03-05 ENCOUNTER — Telehealth: Payer: Self-pay | Admitting: Family Medicine

## 2017-03-05 NOTE — Telephone Encounter (Signed)
errenous °

## 2017-03-07 DIAGNOSIS — N852 Hypertrophy of uterus: Secondary | ICD-10-CM | POA: Insufficient documentation

## 2017-03-07 DIAGNOSIS — E669 Obesity, unspecified: Secondary | ICD-10-CM | POA: Insufficient documentation

## 2017-03-07 DIAGNOSIS — N814 Uterovaginal prolapse, unspecified: Secondary | ICD-10-CM | POA: Insufficient documentation

## 2017-03-08 LAB — PAPLB, HPV, RFX16/18
HPV, high-risk: NEGATIVE
PAP SMEAR COMMENT: 0

## 2017-03-09 ENCOUNTER — Telehealth: Payer: Self-pay

## 2017-03-09 NOTE — Telephone Encounter (Signed)
Called pt no answer. LM for pt informing her of negative PAP.

## 2017-03-09 NOTE — Telephone Encounter (Signed)
-----   Message from Megan LaserAnika Cherry, MD sent at 03/09/2017  1:31 PM EDT ----- Please advise patient on normal pap smear.

## 2017-03-10 NOTE — Telephone Encounter (Signed)
Message left on pts voicemail- neg pap/hpv results per provider

## 2017-03-10 NOTE — Telephone Encounter (Signed)
-----   Message from David James Evans, MD sent at 03/10/2017  2:06 PM EDT ----- Negative Pap and HPV 

## 2017-03-11 ENCOUNTER — Telehealth: Payer: Self-pay

## 2017-03-11 NOTE — Telephone Encounter (Signed)
-----   Message from Linzie Collinavid James Evans, MD sent at 03/10/2017  2:06 PM EDT ----- Negative Pap and HPV

## 2017-03-11 NOTE — Telephone Encounter (Signed)
Informed pt of neg results per provider 

## 2017-07-06 ENCOUNTER — Telehealth: Payer: Self-pay | Admitting: Obstetrics and Gynecology

## 2017-07-06 NOTE — Telephone Encounter (Signed)
The patient called and stated that she needs to speak with a nurse because she was informed to call in January to get her current prescription reduced. Please advise.

## 2017-07-07 MED ORDER — NORETHINDRONE ACETATE 5 MG PO TABS: 5.0000 mg | ORAL_TABLET | Freq: Every day | ORAL | 2 refills | Status: DC

## 2017-07-07 NOTE — Telephone Encounter (Signed)
Actually, I have done a little further investigating and she can actually remain on the the aygestin. She will just need a refilled prescription.

## 2017-07-07 NOTE — Telephone Encounter (Signed)
Pt needs to d/c aygestin and start Micronor, correct? Thanks.

## 2017-07-07 NOTE — Telephone Encounter (Signed)
Pt aware. Med erx. 

## 2017-09-09 ENCOUNTER — Ambulatory Visit: Payer: 59 | Admitting: Family Medicine

## 2017-09-25 ENCOUNTER — Encounter: Payer: Self-pay | Admitting: Family Medicine

## 2017-09-25 ENCOUNTER — Ambulatory Visit: Payer: 59 | Admitting: Family Medicine

## 2017-09-25 VITALS — BP 136/82 | HR 70 | Temp 98.3°F | Resp 14 | Ht 59.5 in | Wt 166.1 lb

## 2017-09-25 DIAGNOSIS — E669 Obesity, unspecified: Secondary | ICD-10-CM

## 2017-09-25 DIAGNOSIS — J309 Allergic rhinitis, unspecified: Secondary | ICD-10-CM | POA: Diagnosis not present

## 2017-09-25 DIAGNOSIS — H1013 Acute atopic conjunctivitis, bilateral: Secondary | ICD-10-CM

## 2017-09-25 DIAGNOSIS — J0100 Acute maxillary sinusitis, unspecified: Secondary | ICD-10-CM

## 2017-09-25 MED ORDER — MONTELUKAST SODIUM 10 MG PO TABS: 10.0000 mg | ORAL_TABLET | Freq: Every day | ORAL | 11 refills | Status: DC

## 2017-09-25 MED ORDER — FLUTICASONE PROPIONATE 50 MCG/ACT NA SUSP: 2.0000 | Freq: Every day | NASAL | 11 refills | Status: DC

## 2017-09-25 MED ORDER — OLOPATADINE HCL 0.2 % OP SOLN: 1.0000 [drp] | Freq: Every day | OPHTHALMIC | 11 refills | Status: DC

## 2017-09-25 MED ORDER — AMOXICILLIN-POT CLAVULANATE 875-125 MG PO TABS: 1.0000 | ORAL_TABLET | Freq: Two times a day (BID) | ORAL | 0 refills | Status: DC

## 2017-09-25 NOTE — Patient Instructions (Addendum)
Start the singulair for allergies Use the eye drops when needed Start the antibiotics Please do eat yogurt or kimchi or take a probiotic daily for the next month We want to replace the healthy germs in the gut If you notice foul, watery diarrhea in the next two months, schedule an appointment RIGHT AWAY or go to an urgent care or the emergency room if a holiday or over a weekend  Try to follow the DASH guidelines (DASH stands for Dietary Approaches to Stop Hypertension). Try to limit the sodium in your diet to no more than 1,500mg  of sodium per day. Certainly try to not exceed 2,000 mg per day at the very most. Do not add salt when cooking or at the table.  Check the sodium amount on labels when shopping, and choose items lower in sodium when given a choice. Avoid or limit foods that already contain a lot of sodium. Eat a diet rich in fruits and vegetables and whole grains, and try to lose weight if overweight or obese  Check out the information at familydoctor.org entitled "Nutrition for Weight Loss: What You Need to Know about Fad Diets" Try to lose between 1-2 pounds per week by taking in fewer calories and burning off more calories You can succeed by limiting portions, limiting foods dense in calories and fat, becoming more active, and drinking 8 glasses of water a day (64 ounces) Don't skip meals, especially breakfast, as skipping meals may alter your metabolism Do not use over-the-counter weight loss pills or gimmicks that claim rapid weight loss A healthy BMI (or body mass index) is between 18.5 and 24.9 You can calculate your ideal BMI at the NIH website [employer]   Allergic Rhinitis, Adult Allergic rhinitis is an allergic reaction that affects the mucous membrane inside the nose. It causes sneezing, a runny or stuffy nose, and the feeling of mucus going down the back of the throat (postnasal drip). Allergic rhinitis can be mild to  severe. There are two types of allergic rhinitis:  Seasonal. This type is also called hay fever. It happens only during certain seasons.  Perennial. This type can happen at any time of the year.  What are the causes? This condition happens when the body's defense system (immune system) responds to certain harmless substances called allergens as though they were germs.  Seasonal allergic rhinitis is triggered by pollen, which can come from grasses, trees, and weeds. Perennial allergic rhinitis may be caused by:  House dust mites.  Pet dander.  Mold spores.  What are the signs or symptoms? Symptoms of this condition include:  Sneezing.  Runny or stuffy nose (nasal congestion).  Postnasal drip.  Itchy nose.  Tearing of the eyes.  Trouble sleeping.  Daytime sleepiness.  How is this diagnosed? This condition may be diagnosed based on:  Your medical history.  A physical exam.  Tests to check for related conditions, such as: ? Asthma. ? Pink eye. ? Ear infection. ? Upper respiratory infection.  Tests to find out which allergens trigger your symptoms. These may include skin or blood tests.  How is this treated? There is no cure for this condition, but treatment can help control symptoms. Treatment may include:  Taking medicines that block allergy symptoms, such as antihistamines. Medicine may be given as a shot, nasal spray, or pill.  Avoiding the allergen.  Desensitization. This treatment involves getting ongoing shots until your body becomes less sensitive to the allergen. This treatment may be done if other treatments  do not help.  If taking medicine and avoiding the allergen does not work, new, stronger medicines may be prescribed.  Follow these instructions at home:  Find out what you are allergic to. Common allergens include smoke, dust, and pollen.  Avoid the things you are allergic to. These are some things you can do to help avoid allergens: ? Replace  carpet with wood, tile, or vinyl flooring. Carpet can trap dander and dust. ? Do not smoke. Do not allow smoking in your home. ? Change your heating and air conditioning filter at least once a month. ? During allergy season:  Keep windows closed as much as possible.  Plan outdoor activities when pollen counts are lowest. This is usually during the evening hours.  When coming indoors, change clothing and shower before sitting on furniture or bedding.  Take over-the-counter and prescription medicines only as told by your health care provider.  Keep all follow-up visits as told by your health care provider. This is important. Contact a health care provider if:  You have a fever.  You develop a persistent cough.  You make whistling sounds when you breathe (you wheeze).  Your symptoms interfere with your normal daily activities. Get help right away if:  You have shortness of breath. Summary  This condition can be managed by taking medicines as directed and avoiding allergens.  Contact your health care provider if you develop a persistent cough or fever.  During allergy season, keep windows closed as much as possible. This information is not intended to replace advice given to you by your health care provider. Make sure you discuss any questions you have with your health care provider. Document Released: 03/11/2001 Document Revised: 07/24/2016 Document Reviewed: 07/24/2016 Elsevier Interactive Patient Education  2018 [employer]  Obesity, Adult Obesity is the condition of having too much total body fat. Being overweight or obese means that your weight is greater than what is considered healthy for your body size. Obesity is determined by a measurement called BMI. BMI is an estimate of body fat and is calculated from height and weight. For adults, a BMI of 30 or higher is considered obese. Obesity can eventually lead to other health concerns and major illnesses,  including:  Stroke.  Coronary artery disease (CAD).  Type 2 diabetes.  Some types of cancer, including cancers of the colon, breast, uterus, and gallbladder.  Osteoarthritis.  High blood pressure (hypertension).  High cholesterol.  Sleep apnea.  Gallbladder stones.  Infertility problems.  What are the causes? The main cause of obesity is taking in (consuming) more calories than your body uses for energy. Other factors that contribute to this condition may include:  Being born with genes that make you more likely to become obese.  Having a medical condition that causes obesity. These conditions include: ? Hypothyroidism. ? Polycystic ovarian syndrome (PCOS). ? Binge-eating disorder. ? Cushing syndrome.  Taking certain medicines, such as steroids, antidepressants, and seizure medicines.  Not being physically active (sedentary lifestyle).  Living where there are limited places to exercise safely or buy healthy foods.  Not getting enough sleep.  What increases the risk? The following factors may increase your risk of this condition:  Having a family history of obesity.  Being a woman of African-American descent.  Being a man of Hispanic descent.  What are the signs or symptoms? Having excessive body fat is the main symptom of this condition. How is this diagnosed? This condition may be diagnosed based on:  Your symptoms.  Your medical history.  A physical exam. Your health care provider may measure: ? Your BMI. If you are an adult with a BMI between 25 and less than 30, you are considered overweight. If you are an adult with a BMI of 30 or higher, you are considered obese. ? The distances around your hips and your waist (circumferences). These may be compared to each other to help diagnose your condition. ? Your skinfold thickness. Your health care provider may gently pinch a fold of your skin and measure it.  How is this treated? Treatment for this  condition often includes changing your lifestyle. Treatment may include some or all of the following:  Dietary changes. Work with your health care provider and a dietitian to set a weight-loss goal that is healthy and reasonable for you. Dietary changes may include eating: ? Smaller portions. A portion size is the amount of a particular food that is healthy for you to eat at one time. This varies from person to person. ? Low-calorie or low-fat options. ? More whole grains, fruits, and vegetables.  Regular physical activity. This may include aerobic activity (cardio) and strength training.  Medicine to help you lose weight. Your health care provider may prescribe medicine if you are unable to lose 1 pound a week after 6 weeks of eating more healthily and doing more physical activity.  Surgery. Surgical options may include gastric banding and gastric bypass. Surgery may be done if: ? Other treatments have not helped to improve your condition. ? You have a BMI of 40 or higher. ? You have life-threatening health problems related to obesity.  Follow these instructions at home:  Eating and drinking   Follow recommendations from your health care provider about what you eat and drink. Your health care provider may advise you to: ? Limit fast foods, sweets, and processed snack foods. ? Choose low-fat options, such as low-fat milk instead of whole milk. ? Eat 5 or more servings of fruits or vegetables every day. ? Eat at home more often. This gives you more control over what you eat. ? Choose healthy foods when you eat out. ? Learn what a healthy portion size is. ? Keep low-fat snacks on hand. ? Avoid sugary drinks, such as soda, fruit juice, iced tea sweetened with sugar, and flavored milk. ? Eat a healthy breakfast.  Drink enough water to keep your urine clear or pale yellow.  Do not go without eating for long periods of time (do not fast) or follow a fad diet. Fasting and fad diets can be  unhealthy and even dangerous. Physical Activity  Exercise regularly, as told by your health care provider. Ask your health care provider what types of exercise are safe for you and how often you should exercise.  Warm up and stretch before being active.  Cool down and stretch after being active.  Rest between periods of activity. Lifestyle  Limit the time that you spend in front of your TV, computer, or video game system.  Find ways to reward yourself that do not involve food.  Limit alcohol intake to no more than 1 drink a day for nonpregnant women and 2 drinks a day for men. One drink equals 12 oz of beer, 5 oz of wine, or 1 oz of hard liquor. General instructions  Keep a weight loss journal to keep track of the food you eat and how much you exercise you get.  Take over-the-counter and prescription medicines only as told by your  health care provider.  Take vitamins and supplements only as told by your health care provider.  Consider joining a support group. Your health care provider may be able to recommend a support group.  Keep all follow-up visits as told by your health care provider. This is important. Contact a health care provider if:  You are unable to meet your weight loss goal after 6 weeks of dietary and lifestyle changes. This information is not intended to replace advice given to you by your health care provider. Make sure you discuss any questions you have with your health care provider. Document Released: 07/24/2004 Document Revised: 11/19/2015 Document Reviewed: 04/04/2015 Elsevier Interactive Patient Education  2018 [employer].

## 2017-09-25 NOTE — Progress Notes (Signed)
BP 136/82   Pulse 70   Temp 98.3 F (36.8 C) (Oral)   Resp 14   Ht 4' 11.5" (1.511 m)   Wt 166 lb 1.6 oz (75.3 kg)   LMP 02/20/2017   SpO2 98%   BMI 32.99 kg/m    Subjective:    Patient ID: Megan Bruce, female    DOB: 1961-03-24, 57 y.o.   MRN: [id number]  HPI: Megan LoJanice Grego is a 57 y.o. female  Chief Complaint  Patient presents with  . Allergies  . Medication Refill    eye drops  . Sinusitis    HPI Patient is here for follow-up "It is that time of the year"; "I have real bad allergies"; [facility], then went to Dr. Yehuda MaoWillard in [place], then went to [facility]; eye drops have worked well; itchy and watery eyes, scratchy skin; takes zyrtec; she has not tried singulair; she uses the nasal spray occasionally, flonase too sometimes  When her nose gets clogged up, her right eye gets irritated; feels puffy and has pressure; [place] went [place] [facility] [place] and had a sinus infection and they gave her antibiotics, cleared it up; had another with [facility] and it was treated again; now happening again; unable to give me a definitve time frame of length of involvement; wears masks at work; blowing normal stuff out; has a sore in the left nostril; tried vaseline up in there, helps sometimes; having some irritation blood tinge, not outright bleeding; no sore throat; a few weeks ago, she was real sick, thought she had a stomach virus, started on a Friday night, in bed Saturday and Sunday; took some Pepto-Bismol to slow things down a few weeks ago; that is cleared up now mostly; just occasionally has a little blow out; Dr. Valentino Saxonherry put her on some medicine for that  Obesity; she is trying to cut down on the chocolate; trying to drink water instead of soda  [facility] 09/25/2017 08/28/2016 07/18/2016  Decreased Interest 0 0 0  Down, Depressed, Hopeless 0 0 0  PHQ - 2 Score 0 0 0   Relevant past medical, surgical, family and social history reviewed Past  Medical History:  Diagnosis Date  . Allergy   . GERD (gastroesophageal reflux disease)   . Hypertension   . Urge incontinence    Past Surgical History:  Procedure Laterality Date  . TOE SURGERY     Family History  Problem Relation Age of Onset  . Stroke Mother   . Hypertension Mother   . Kidney disease Mother   . Congestive Heart Failure Mother   . Heart disease Mother        CHF  . Stroke Father   . Hypertension Father   . Heart disease Maternal Grandmother   . Cancer Neg Hx   . COPD Neg Hx   . Diabetes Neg Hx    Social History   Tobacco Use  . Smoking status: Never Smoker  . Smokeless tobacco: Never Used  Substance Use Topics  . Alcohol use: No  . Drug use: No    Interim medical history since last visit reviewed. Allergies and medications reviewed  Review of Systems Per HPI unless specifically indicated above     Objective:    BP 136/82   Pulse 70   Temp 98.3 F (36.8 C) (Oral)   Resp 14   Ht 4' 11.5" (1.511 m)   Wt 166 lb 1.6 oz (75.3  kg)   LMP 02/20/2017   SpO2 98%   BMI 32.99 kg/m   Wt Readings from Last 3 Encounters:  09/25/17 166 lb 1.6 oz (75.3 kg)  03/04/17 168 lb 12.8 oz (76.6 kg)  01/01/17 164 lb 8 oz (74.6 kg)    Physical Exam  Constitutional: She appears well-developed and well-nourished.  HENT:  Right Ear: Tympanic membrane is not erythematous. No middle ear effusion.  Left Ear: Tympanic membrane is not erythematous.  No middle ear effusion.  Nose: Mucosal edema and rhinorrhea present.  Mouth/Throat: Mucous membranes are normal. No posterior oropharyngeal edema or posterior oropharyngeal erythema.  Eyes: EOM are normal. No scleral icterus.  Cardiovascular: Normal rate and regular rhythm.  Pulmonary/Chest: Effort normal and breath sounds normal.  Lymphadenopathy:    She has no cervical adenopathy.  Skin: No rash noted.  Psychiatric: She has a normal mood and affect. Her behavior is normal. Her mood appears not anxious. She does  not exhibit a depressed mood.        Assessment & Plan:   Problem List Items Addressed This Visit      Respiratory   Allergic rhinitis    Avoid decongestants; antihistamine        Other   Mild obesity    Weight loss encouraged; education material provided      Allergic conjunctivitis    Antihistamine       Other Visit Diagnoses    Acute non-recurrent maxillary sinusitis    -  Primary   antibiotics, symptomatic care   Relevant Medications   amoxicillin-clavulanate (AUGMENTIN) 875-125 MG tablet       Follow up plan: Return in about 1 year (around 09/26/2018) for follow-up visit with Dr. Sherie Don.  An after-visit summary was printed and given to the patient at check-out.  Please see the patient instructions which may contain other information and recommendations beyond what is mentioned above in the assessment and plan.  Meds ordered this encounter  Medications  . Olopatadine HCl 0.2 % SOLN    Sig: Apply 1 drop to eye daily.    Dispense:  2.5 mL    Refill:  11  . montelukast (SINGULAIR) 10 MG tablet    Sig: Take 1 tablet (10 mg total) by mouth at bedtime.    Dispense:  30 tablet    Refill:  11  . amoxicillin-clavulanate (AUGMENTIN) 875-125 MG tablet    Sig: Take 1 tablet by mouth 2 (two) times daily.    Dispense:  20 tablet    Refill:  0  . DISCONTD: fluticasone (FLONASE) 50 MCG/ACT nasal spray    Sig: Place 2 sprays into both nostrils daily.    Dispense:  16 g    Refill:  11    No orders of the defined types were placed in this encounter.

## 2017-09-28 ENCOUNTER — Other Ambulatory Visit: Payer: Self-pay | Admitting: Family Medicine

## 2017-09-28 MED ORDER — MOMETASONE FUROATE 50 MCG/ACT NA SUSP: 2.0000 | Freq: Every day | NASAL | 12 refills | Status: DC

## 2017-09-30 DIAGNOSIS — H1010 Acute atopic conjunctivitis, unspecified eye: Secondary | ICD-10-CM | POA: Insufficient documentation

## 2017-09-30 DIAGNOSIS — J309 Allergic rhinitis, unspecified: Secondary | ICD-10-CM | POA: Insufficient documentation

## 2017-09-30 NOTE — Assessment & Plan Note (Signed)
Anti-histamine 

## 2017-09-30 NOTE — Assessment & Plan Note (Signed)
Avoid decongestants; antihistamine

## 2017-09-30 NOTE — Assessment & Plan Note (Signed)
Weight loss encouraged; education material provided

## 2017-10-14 IMAGING — US US TRANSVAGINAL NON-OB
1 series · 13 of 25 positions shown · non-contrast
Comparison: None in PACs

CLINICAL DATA: Bilateral pelvic pain which began following
implanted contraceptive removal from the arm 6 months ago; painful.
Since that time. Onset of the most recent menstrual period was
yesterday.

EXAM:
TRANSABDOMINAL AND TRANSVAGINAL ULTRASOUND OF PELVIS
TECHNIQUE: Both transabdominal and transvaginal ultrasound examinations of the
pelvis were performed. Transabdominal technique was performed for
global imaging of the pelvis including uterus, ovaries, adnexal
regions, and pelvic cul-de-sac. It was necessary to proceed with
endovaginal exam following the transabdominal exam to visualize the
endometrium and ovaries.

[Series 1: us transvaginal non-ob · 0.22mm/px · 13 of 101 slices shown]
[im 1/101]
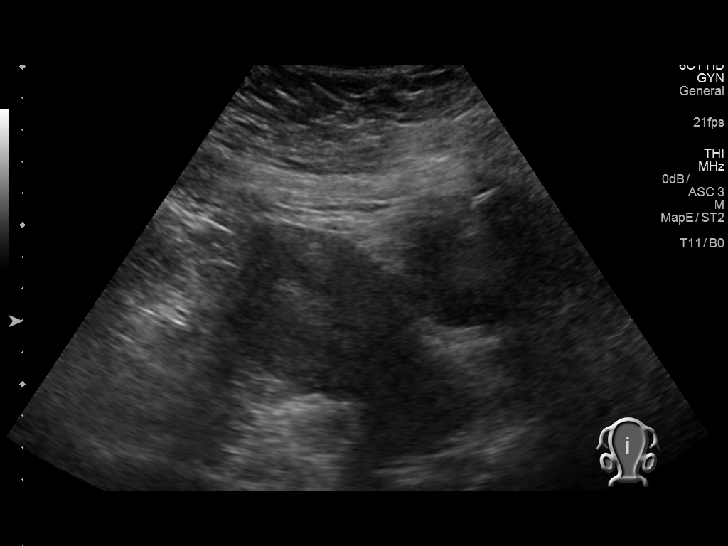
[im 9/101]
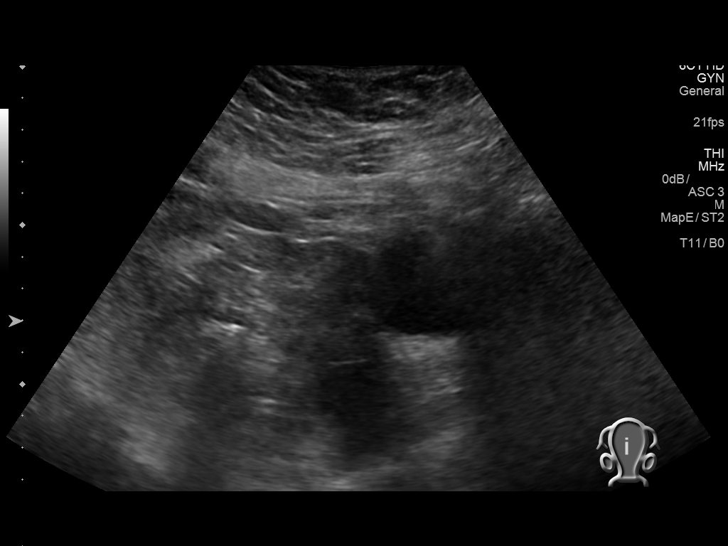
[im 17/101]
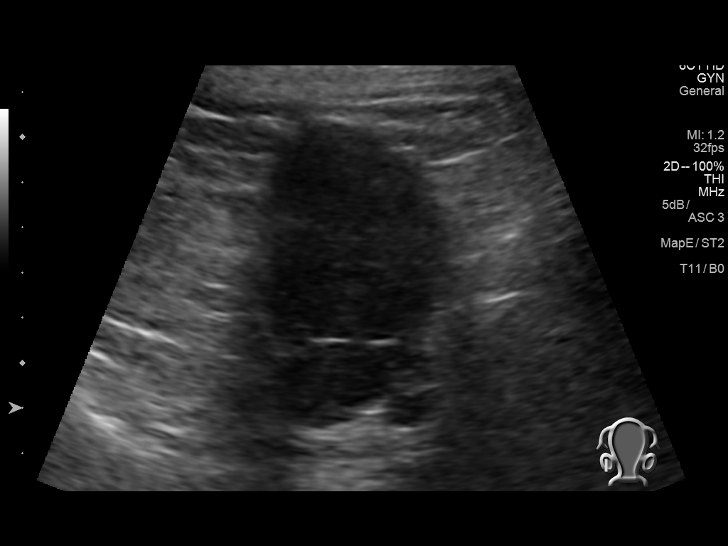
[im 26/101]
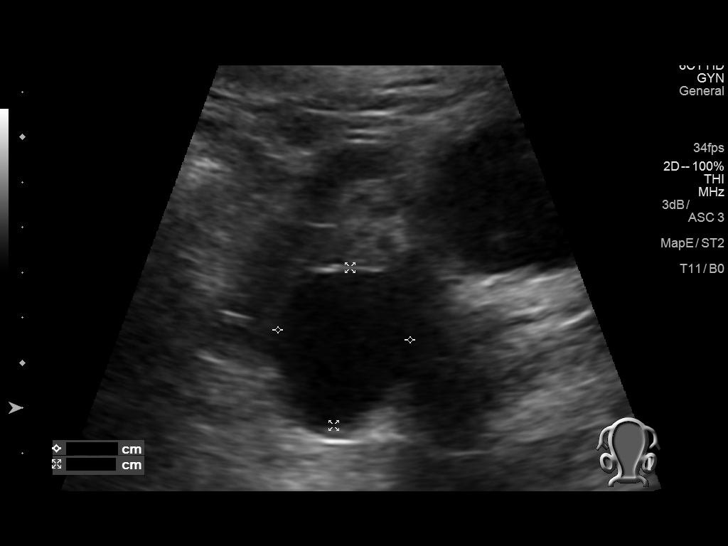
[im 34/101]
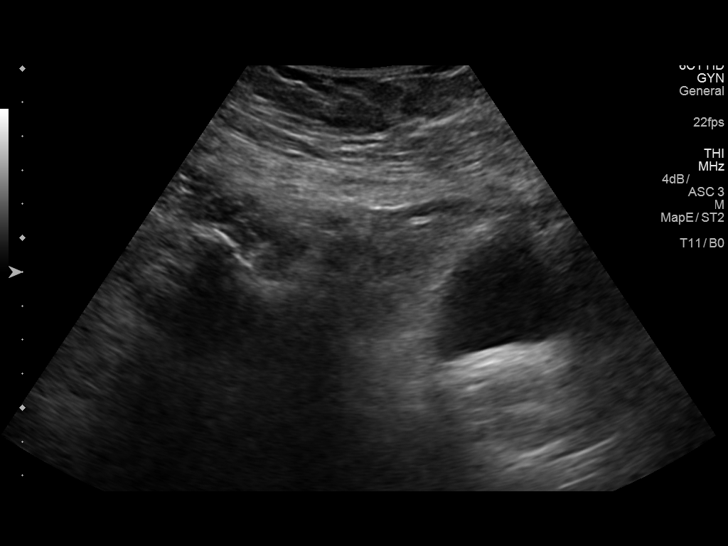
[im 42/101]
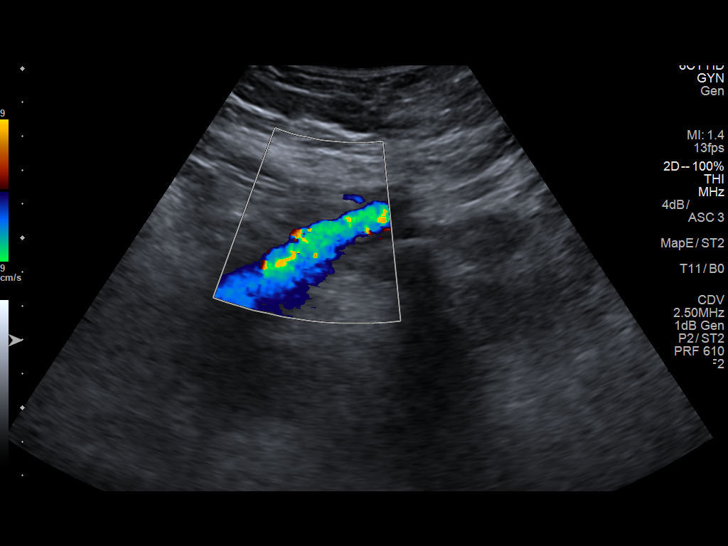
[im 51/101]
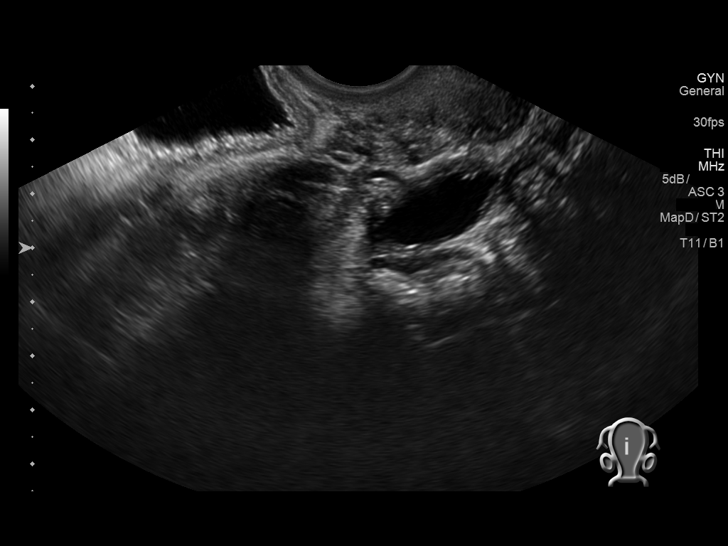
[im 59/101]
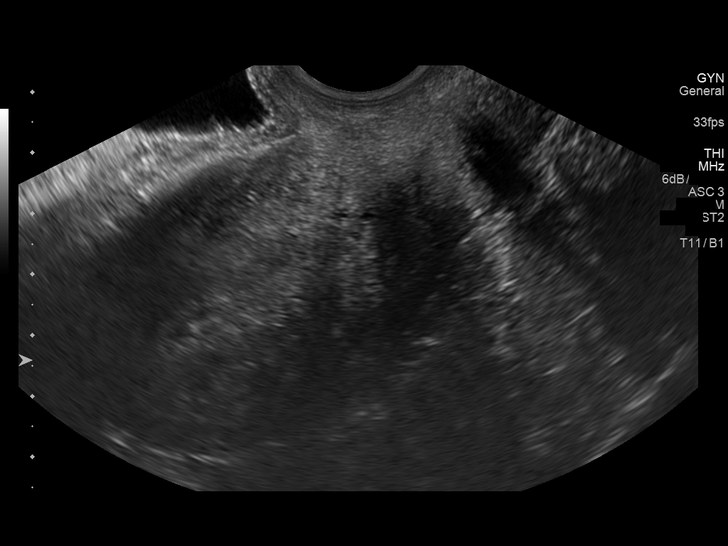
[im 67/101]
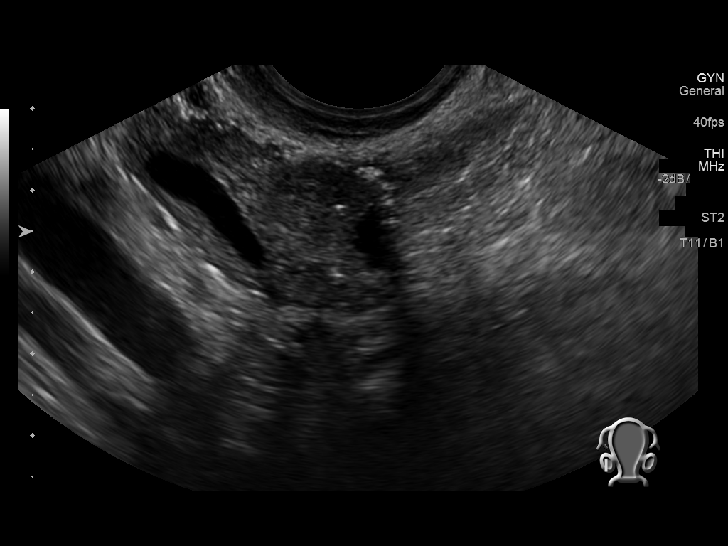
[im 76/101]
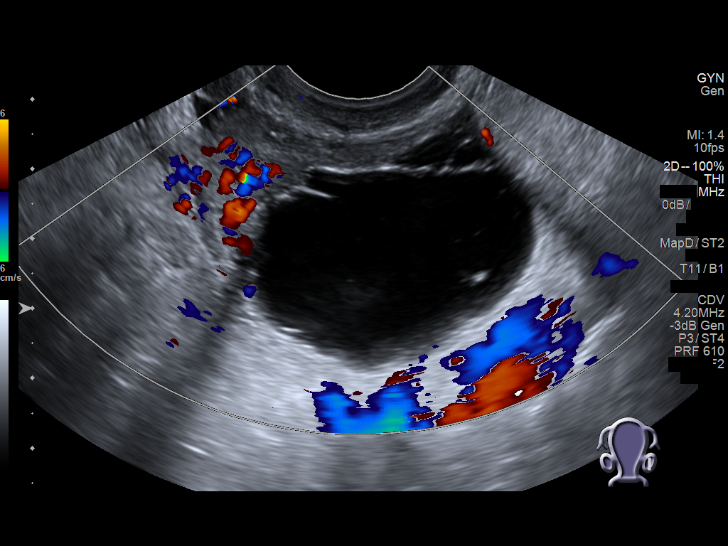
[im 84/101]
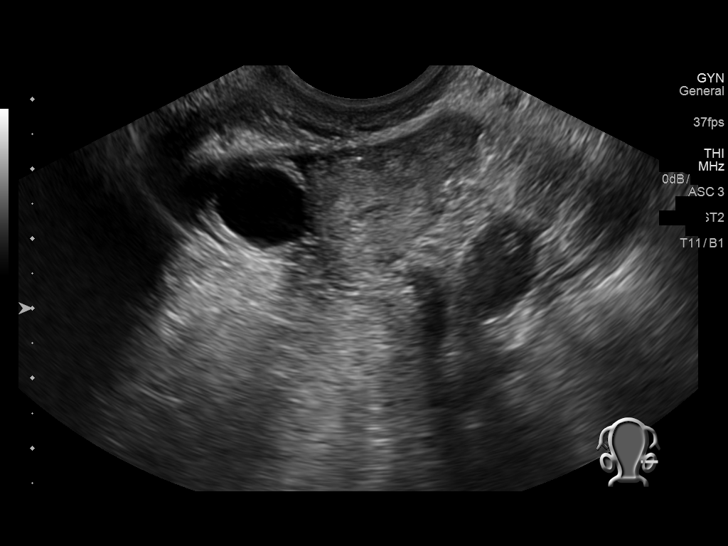
[im 92/101]
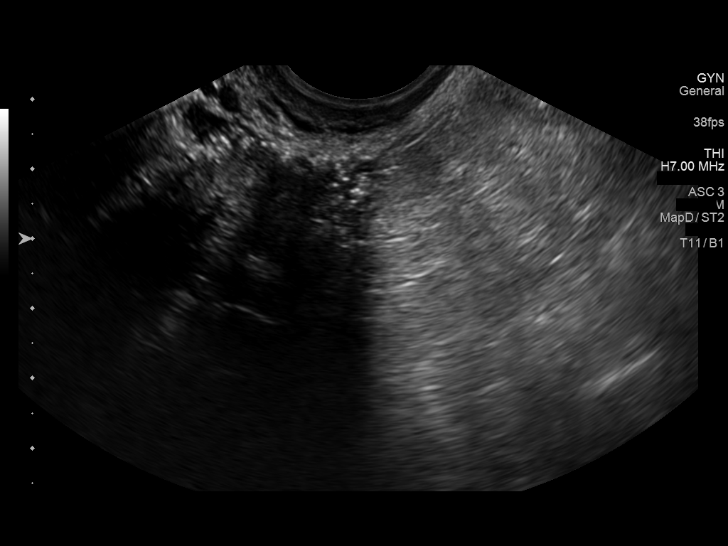
[im 101/101]
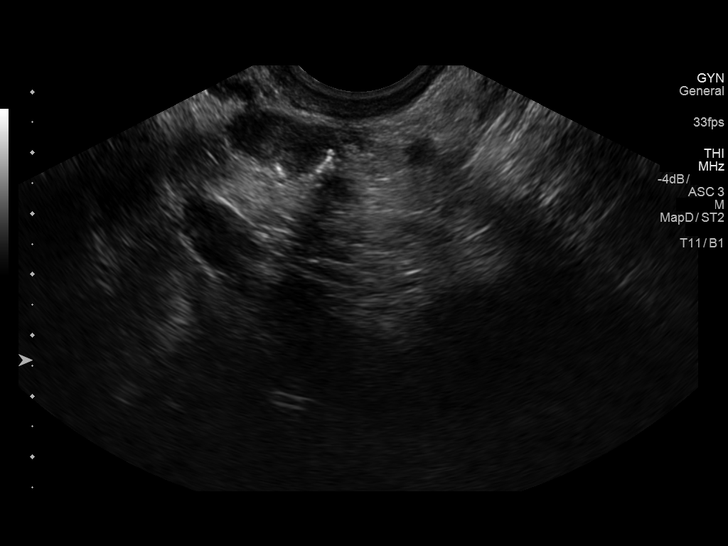

[13 of 25 positions shown; findings below may reference images not displayed]

FINDINGS: Uterus

Measurements: 9.1 x 4.7 x 5.3 cm. Heterogeneous echotexture with no
discrete fibroids or other parenchymal abnormality.

Endometrium

Thickness: 9.2 mm.  No focal abnormality visualized.

Right ovary

Measurements: 3.6 x 1.6 x 2.1 cm.. Normal appearance/no adnexal
mass.

Left ovary

Measurements: 5.4 x 3.5 x 4.4 cm. There is a left ovarian cystic
structure exhibiting incomplete septations measuring 4 x 3.1 x
cm.

Other findings

No free fluid.
IMPRESSION: 1. Cystic left ovarian mass with incomplete septations. The maximal
dimension is 4 cm. Follow-up ultrasound in 6-12 weeks is recommended
to reassess this structure.
2. The uterus and right ovary exhibit no acute abnormalities. There
is no free pelvic fluid.

## 2017-11-16 ENCOUNTER — Ambulatory Visit: Payer: 59 | Admitting: Nurse Practitioner

## 2017-11-16 DIAGNOSIS — M654 Radial styloid tenosynovitis [de Quervain]: Secondary | ICD-10-CM | POA: Insufficient documentation

## 2017-12-17 ENCOUNTER — Ambulatory Visit: Payer: Self-pay | Admitting: Family Medicine

## 2017-12-17 NOTE — Telephone Encounter (Signed)
Outgoing call to patient to assess symptoms. Patient was calling to see if provider could call in a prescription for allergies.  Patient states that she has runny nose , sneezing and with watery eyes.  Rates the severity 5 to 6.  Eyes are itchy.  Patient states she is in and outside at work during the day.  Could not remember the name of medication the provider prescribed earlier, but did complete the course of the medication. Denies coughing or difficulty breathing  Or wheezing. Related to patient   that per protocol it is recommended that she be evaluated  within three days. Pt. Would like to check her schedule and call back tomorrow and make an appointment.   Reason for Disposition . [1] Taking antihistamines > 2 days AND [2] nasal allergy symptoms interfere with sleep, school, or work  Answer Assessment - Initial Assessment Questions 1. SYMPTOM: "What's the main symptom you're concerned about?" (e.g., runny nose, stuffiness, sneezing, itching)     runniy nose, sneezing watery eyes.  2. SEVERITY: "How bad is it?" "What does it keep you from doing?" (e.g., sleeping, working)      5 to 6 3. EYES: "Are the eyes also red, watery, and itchy?"      yes 4. TRIGGER: "What pollen or other allergic substance do you think is causing the symptoms?"      In and outside during break at work during break 5. TREATMENT: "What medicine are you using?" "What medicine worked best in the past?"     couldn't remember the name of medication but has completed all of it.   6. OTHER SYMPTOMS: "Do you have any other symptoms?" (e.g., coughing, difficulty breathing, wheezing)     Denies coughing  difficulty breathing, or wheezing  Protocols used: NASAL ALLERGIES (HAY FEVER)-A-AH

## 2017-12-18 MED ORDER — LORATADINE 10 MG PO TABS: 10.0000 mg | ORAL_TABLET | Freq: Every day | ORAL | 11 refills | Status: DC | PRN

## 2017-12-18 NOTE — Addendum Note (Signed)
Addended by: LADA, Janit BernMELINDA P on: 12/18/2017 04:42 PM   Modules accepted: Orders

## 2017-12-18 NOTE — Telephone Encounter (Signed)
The best I can do is have her CONTINUE the mometasone (Nasonex nasal spray) and olopatadine (eye drops) and montelukast (Singulair) and ADD OTC claritin Otherwise, we could offer a referral to an allergiest

## 2017-12-18 NOTE — Telephone Encounter (Signed)
Pt.notified

## 2018-03-10 ENCOUNTER — Encounter: Payer: 59 | Admitting: Obstetrics and Gynecology

## 2018-04-15 ENCOUNTER — Ambulatory Visit: Payer: 59 | Admitting: Family Medicine

## 2018-04-15 ENCOUNTER — Encounter: Payer: Self-pay | Admitting: Family Medicine

## 2018-04-15 VITALS — BP 120/76 | HR 69 | Temp 98.0°F | Ht 59.0 in | Wt 172.0 lb

## 2018-04-15 DIAGNOSIS — R39198 Other difficulties with micturition: Secondary | ICD-10-CM

## 2018-04-15 DIAGNOSIS — M654 Radial styloid tenosynovitis [de Quervain]: Secondary | ICD-10-CM

## 2018-04-15 DIAGNOSIS — R635 Abnormal weight gain: Secondary | ICD-10-CM | POA: Diagnosis not present

## 2018-04-15 MED ORDER — THUMB SPLINT/RIGHT MEDIUM MISC: 0 refills | Status: DC

## 2018-04-15 MED ORDER — MELOXICAM 15 MG PO TABS: 15.0000 mg | ORAL_TABLET | Freq: Every day | ORAL | 0 refills | Status: DC

## 2018-04-15 MED ORDER — THUMB SPLINT/LEFT MEDIUM MISC: 0 refills | Status: DC

## 2018-04-15 MED ORDER — FAMOTIDINE 20 MG PO TABS: 20.0000 mg | ORAL_TABLET | Freq: Two times a day (BID) | ORAL | 0 refills | Status: DC

## 2018-04-15 NOTE — Patient Instructions (Signed)
Start the anti-inflammatory meloxicam If you need something for aches or pains, try to use Tylenol (acetaminophen) instead of other non-steroidals (which include Aleve, ibuprofen, Advil, Motrin, and naproxen); non-steroidals Wear the wrist braces as much as possible Watch your activity and wear the braces Avoid caffeine and chocolate You can use ice topically 3 x a day with protective layer between your skin and the ice Return for a physical in 4 weeks

## 2018-04-15 NOTE — Progress Notes (Signed)
BP 120/76   Pulse 69   Temp 98 F (36.7 C)   Ht 4\' 11"  (1.499 m)   Wt 172 lb (78 kg)   LMP 02/20/2017   SpO2 98%   BMI 34.74 kg/m    Subjective:    Patient ID: Megan Bruce, female    DOB: 07-Jun-1961, 57 y.o.   MRN: [id number]  HPI: Megan Bruce is a 57 y.o. female  Chief Complaint  Patient presents with  . 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 They put a shot in her hand and it didn't last long; she went back again and got another shot She saw seen by Hurshel Keys at [facility] twice May and August Tylenol helps for a short period of time  She also mentions that she sometimes urinates, then stands up, then thinks she has to urinate again; wonders if related to hormones changing; she has gained some weight; going to come back in for exam, physical  She does not want a mammogram; says they "pinched" her with last mammo  Depression screen [facility] 2/9 04/15/2018 09/25/2017 08/28/2016 07/18/2016  Decreased Interest 0 0 0 0  Down, Depressed, Hopeless 0 0 0 0  PHQ - 2 Score 0 0 0 0  Altered sleeping 0 - - -  Tired, decreased energy 0 - - -  Change in appetite 0 - - -  Feeling bad or failure about yourself  0 - - -  Trouble concentrating 0 - - -  Moving slowly or fidgety/restless 0 - - -  Suicidal thoughts 0 - - -  PHQ-9 Score 0 - - -  Difficult doing work/chores Not difficult at all - - -   Fall Risk  04/15/2018 09/25/2017 08/28/2016 07/18/2016  Falls in the past year? No No No No    Relevant past medical, surgical, family and social history reviewed Past Medical History:  Diagnosis Date  . Allergy   . GERD (gastroesophageal reflux disease)   . Hypertension   . Urge  incontinence    Past Surgical History:  Procedure Laterality Date  . TOE SURGERY     Family History  Problem Relation Age of Onset  . Stroke Mother   . Hypertension Mother   . Kidney disease Mother   . Congestive Heart Failure Mother   . Heart disease Mother        CHF  . Stroke Father   . Hypertension Father   . Heart disease Maternal Grandmother   . Cancer Neg Hx   . COPD Neg Hx   . Diabetes Neg Hx    Social History   Tobacco Use  . Smoking status: Never Smoker  . Smokeless tobacco: Never Used  Substance Use Topics  . Alcohol use: No  . Drug use: No     Office Visit from 04/15/2018 in [facility]  AUDIT-C Score  0      Interim medical history since last visit reviewed. Allergies and medications reviewed  Review of Systems Per HPI unless specifically indicated above     Objective:    BP 120/76   Pulse 69   Temp  98 F (36.7 C)   Ht 4\' 11"  (1.499 m)   Wt 172 lb (78 kg)   LMP 02/20/2017   SpO2 98%   BMI 34.74 kg/m   Wt Readings from Last 3 Encounters:  04/15/18 172 lb (78 kg)  09/25/17 166 lb 1.6 oz (75.3 kg)  03/04/17 168 lb 12.8 oz (76.6 kg)    Physical Exam  Constitutional: She appears well-developed and well-nourished.  HENT:  Mouth/Throat: Mucous membranes are normal.  Eyes: EOM are normal. No scleral icterus.  Cardiovascular: Normal rate and regular rhythm.  Pulmonary/Chest: Effort normal and breath sounds normal.  Musculoskeletal:       Right wrist: She exhibits tenderness. She exhibits normal range of motion.       Left wrist: She exhibits tenderness. She exhibits normal range of motion.  Positive Finkelstein's tests  Psychiatric: She has a normal mood and affect. Her behavior is normal.    Results for orders placed or performed in visit on 03/04/17  PapLb, HPV, rfx16/18  Result Value Ref Range   DIAGNOSIS: Comment    Specimen adequacy: Comment    Clinician Provided ICD10 Comment    Performed by: Comment    PAP  Smear Comment .    Note: Comment    HPV, high-risk Negative Negative      Assessment & Plan:   Problem List Items Addressed This Visit    None    Visit Diagnoses    De Quervain's tenosynovitis, bilateral    -  Primary   meloxicam, no other NSAIDs; may use tylenol; braces bilaterally; if she thinks it is work-related, I advised her to contact her employer, look at ergonomics   Weight gain       if persisting, then check TSH at f/u   Subjective change in urination       sounds positional; may be prolapse?; she'll return for exam, check urine them; adjust voiding, avoid caffeine and chocolate       Follow up plan: No follow-ups on file.  An after-visit summary was printed and given to the patient at check-out.  Please see the patient instructions which may contain other information and recommendations beyond what is mentioned above in the assessment and plan.  Meds ordered this encounter  Medications  . Elastic Bandages & Supports (THUMB SPLINT/LEFT MEDIUM) MISC    Sig: Wear as much as possible during the day and night    Dispense:  1 each    Refill:  0  . Elastic Bandages & Supports (THUMB SPLINT/RIGHT MEDIUM) MISC    Sig: Wear as much as possible during the day and night    Dispense:  1 each    Refill:  0  . meloxicam (MOBIC) 15 MG tablet    Sig: Take 1 tablet (15 mg total) by mouth daily. With food (take daily for 2 weeks, then daily only as needed)    Dispense:  30 tablet    Refill:  0  . famotidine (PEPCID) 20 MG tablet    Sig: Take 1 tablet (20 mg total) by mouth 2 (two) times daily.    Dispense:  30 tablet    Refill:  0    No orders of the defined types were placed in this encounter.

## 2018-05-17 ENCOUNTER — Encounter: Payer: 59 | Admitting: Family Medicine

## 2018-05-18 ENCOUNTER — Other Ambulatory Visit: Payer: Self-pay | Admitting: Family Medicine

## 2018-09-27 ENCOUNTER — Ambulatory Visit: Payer: 59 | Admitting: Family Medicine

## 2018-10-19 ENCOUNTER — Other Ambulatory Visit: Payer: Self-pay | Admitting: Family Medicine

## 2018-10-19 NOTE — Telephone Encounter (Signed)
I reached voicemail Left message, I'm calling to discuss one of her medicines ----------------------------------------------------------------------------- Please let patient know: Recent black box warning for singulair (irritability, depression, suicidality); ask if she has had any problems, offer assistance right away if yes For allergic rhinitis, we will encourage her to use only her antihistamine and nasal corticosteroid If allergies not controlled, we can refer to allergist, please REFER if desired

## 2018-10-20 NOTE — Telephone Encounter (Signed)
Left detailed voicemail

## 2018-10-29 ENCOUNTER — Other Ambulatory Visit: Payer: Self-pay | Admitting: Family Medicine

## 2019-04-11 ENCOUNTER — Other Ambulatory Visit: Payer: Self-pay

## 2019-04-11 DIAGNOSIS — Z20822 Contact with and (suspected) exposure to covid-19: Secondary | ICD-10-CM

## 2019-04-12 LAB — NOVEL CORONAVIRUS, NAA: SARS-CoV-2, NAA: NOT DETECTED

## 2019-04-13 ENCOUNTER — Telehealth: Payer: Self-pay | Admitting: Hematology

## 2019-04-13 NOTE — Telephone Encounter (Signed)
Pt is aware covid 19 test is negative °

## 2019-07-11 ENCOUNTER — Encounter: Payer: Self-pay | Admitting: Podiatry

## 2019-07-11 ENCOUNTER — Other Ambulatory Visit: Payer: Self-pay | Admitting: Podiatry

## 2019-07-11 ENCOUNTER — Ambulatory Visit (INDEPENDENT_AMBULATORY_CARE_PROVIDER_SITE_OTHER): Payer: 59

## 2019-07-11 ENCOUNTER — Ambulatory Visit: Payer: 59 | Admitting: Podiatry

## 2019-07-11 ENCOUNTER — Other Ambulatory Visit: Payer: Self-pay

## 2019-07-11 DIAGNOSIS — M79672 Pain in left foot: Secondary | ICD-10-CM | POA: Diagnosis not present

## 2019-07-11 DIAGNOSIS — M79671 Pain in right foot: Secondary | ICD-10-CM | POA: Diagnosis not present

## 2019-07-11 DIAGNOSIS — M722 Plantar fascial fibromatosis: Secondary | ICD-10-CM | POA: Diagnosis not present

## 2019-07-11 DIAGNOSIS — M779 Enthesopathy, unspecified: Secondary | ICD-10-CM

## 2019-07-11 NOTE — Progress Notes (Signed)
  Subjective:  Patient ID: Megan Bruce, female    DOB: Jan 16, 1961,  MRN: [id number]  Chief Complaint  Patient presents with  . Foot Pain    Patient presents with bilat heel pain x 2-3 months, left much worse than right    59 y.o. female presents with the above complaint.  Patient presents with bilateral heel pain left greater than right.  Patient states the left side started first and has progressively gotten worse and on the right side started likely due to compensate Tory mechanism.  She states is worse in the morning and constantly hurting when standing.  She states that her work is in [employer] and is constantly on a concrete floor and walking.  She states she has been doing for 23 years.  She states she is.  Feels like walking on a bruise.  She has tried soaking in hot water and Advil but has not helped.  She denies any other acute complaints.  She denies seeing anyone else for this.   Review of Systems: Negative except as noted in the HPI. Denies N/V/F/Ch.  Past Medical History:  Diagnosis Date  . Allergy   . GERD (gastroesophageal reflux disease)   . Hypertension   . Urge incontinence     Current Outpatient Medications:  .  Elastic Bandages & Supports (THUMB SPLINT/LEFT MEDIUM) MISC, Wear as much as possible during the day and night, Disp: 1 each, Rfl: 0 .  Elastic Bandages & Supports (THUMB SPLINT/RIGHT MEDIUM) MISC, Wear as much as possible during the day and night, Disp: 1 each, Rfl: 0 .  Olopatadine HCl 0.2 % SOLN, Apply 1 drop to eye daily., Disp: 2.5 mL, Rfl: 11  Social History   Tobacco Use  Smoking Status Never Smoker  Smokeless Tobacco Never Used    Allergies  Allergen Reactions  . Codeine     Tablet form   Objective:  There were no vitals filed for this visit. There is no height or weight on file to calculate BMI. Constitutional Well developed. Well nourished.  Vascular Dorsalis pedis pulses palpable bilaterally. Posterior tibial pulses palpable  bilaterally. Capillary refill normal to all digits.  No cyanosis or clubbing noted. Pedal hair growth normal.  Neurologic Normal speech. Oriented to person, place, and time. Epicritic sensation to light touch grossly present bilaterally.  Dermatologic Nails well groomed and normal in appearance. No open wounds. No skin lesions.  Orthopedic: Normal joint ROM without pain or crepitus bilaterally. No visible deformities. Tender to palpation at the calcaneal tuber bilaterally. No pain with calcaneal squeeze bilaterally. Ankle ROM diminished range of motion bilaterally. Silfverskiold Test: positive bilaterally.   Radiographs: Taken and reviewed. No acute fractures or dislocations. No evidence of stress fracture.  Plantar heel spur present. Posterior heel spur present.   Assessment:   1. Tendinitis   2. Plantar fasciitis of right foot    Plan:  Patient was evaluated and treated and all questions answered.  Plantar Fasciitis, bilaterally - XR reviewed as above.  - Educated on icing and stretching. Instructions given.  - Injection delivered to the plantar fascia as below. - DME: Plantar Fascial Brace x2 - Pharmacologic management: None  Procedure: Injection Tendon/Ligament x2 Location: Bilateral plantar fascia at the glabrous junction; medial approach. Skin Prep: alcohol Injectate: 0.5 cc 0.5% marcaine plain, 0.5 cc of 1% Lidocaine, 0.5 cc kenalog 10. Disposition: Patient tolerated procedure well. Injection site dressed with a band-aid.  No follow-ups on file.

## 2019-08-08 ENCOUNTER — Ambulatory Visit: Payer: 59 | Admitting: Podiatry

## 2019-09-27 ENCOUNTER — Ambulatory Visit: Payer: Self-pay | Admitting: *Deleted

## 2019-09-27 ENCOUNTER — Ambulatory Visit: Payer: Self-pay

## 2019-09-27 NOTE — Telephone Encounter (Signed)
Patient requesting to speak with RN to discuss what patient thinks is a pinched nerve in her shoulder/back and ways to alleviate pain.

## 2019-09-27 NOTE — Telephone Encounter (Signed)
Returned call to patient regarding possibly pinched nerve.  She stated that she has had this problem for about 2 weeks and that she had it before. She was treated by Dr. Sherie Don for a pinched nerve and wanted to know if she could get an appointment and be prescribed the medication, like what she had before. She does not know the name of the medication. She denies numbness or tingling, fever, swelling, or rash. Per protocol, she should be seen within 3 days.  No appointment available with the providers. Routing to [facility] for an appointment after 3 pm any day. Pt voiced understanding.  Reason for Disposition . [1] MODERATE pain (e.g., interferes with normal activities) AND [2] present > 3 days  Answer Assessment - Initial Assessment Questions 1. ONSET: "When did the pain start?"     2 weeks ago 2. LOCATION: "Where is the pain located?"     Right shoulder 3. PAIN: "How bad is the pain?" (Scale 1-10; or mild, moderate, severe)   - MILD (1-3): doesn't interfere with normal activities   - MODERATE (4-7): interferes with normal activities (e.g., work or school) or awakens from sleep   - SEVERE (8-10): excruciating pain, unable to do any normal activities, unable to move arm at all due to pain     Pain # 7 4. WORK OR EXERCISE: "Has there been any recent work or exercise that involved this part of the body?"     no 5. CAUSE: "What do you think is causing the shoulder pain?"     pinched nerve 6. OTHER SYMPTOMS: "Do you have any other symptoms?" (e.g., neck pain, swelling, rash, fever, numbness, weakness)     no 7. PREGNANCY: "Is there any chance you are pregnant?" "When was your last menstrual period?"     n/a  Protocols used: SHOULDER PAIN-A-AH

## 2019-09-27 NOTE — Telephone Encounter (Signed)
Incoming call from Patient  With complaint of pinched nerve sensation .  Hard to sleep on shoulder.  Patient states its hard to sleep .  Request an appointment.  Taking tylenol  And Ibuprofen.      Answer Assessment - Initial Assessment Questions 1. SYMPTOM: "What is the main symptom you are concerned about?" (e.g., weakness, numbness)    2. ONSET: "When did this start?" (minutes, hours, days; while sleeping)     2 weeks 3. LAST NORMAL: "When was the last time you were normal (no symptoms)?"     *No Answer* 4. PATTERN "Does this come and go, or has it been constant since it started?"  "Is it present now?"    Comes and goes 5. CARDIAC SYMPTOMS: "Have you had any of the following symptoms: chest pain, difficulty breathing, palpitations?"     denies 6. NEUROLOGIC SYMPTOMS: "Have you had any of the following symptoms: headache, dizziness, vision loss, double vision, changes in speech, unsteady on your feet?"    Denies, Just pain 7. OTHER SYMPTOMS: "Do you have any other symptoms?"     Denies  8. PREGNANCY: "Is there any chance you are pregnant?" "When was your last menstrual period na      *No Answer*  Protocols used: NEUROLOGIC DEFICIT-A-AH

## 2019-09-28 NOTE — Telephone Encounter (Signed)
lvm to try and schedule an appt per pt request. Also let her know that if she has the my chart she can go on there and schedule an appt. At this time Megan Bruce is the only avail today

## 2019-09-28 NOTE — Telephone Encounter (Signed)
Lvm to sch appt. Also let her know in the message that if she has the my chart she can go on and sch appt. See note from first incounter

## 2019-11-08 ENCOUNTER — Ambulatory Visit: Payer: 59 | Admitting: Podiatry

## 2019-11-22 ENCOUNTER — Ambulatory Visit: Payer: 59 | Admitting: Podiatry

## 2019-11-22 ENCOUNTER — Other Ambulatory Visit: Payer: Self-pay

## 2019-11-22 DIAGNOSIS — M79671 Pain in right foot: Secondary | ICD-10-CM | POA: Diagnosis not present

## 2019-11-22 DIAGNOSIS — Q666 Other congenital valgus deformities of feet: Secondary | ICD-10-CM | POA: Diagnosis not present

## 2019-11-22 DIAGNOSIS — M79672 Pain in left foot: Secondary | ICD-10-CM

## 2019-11-22 DIAGNOSIS — M722 Plantar fascial fibromatosis: Secondary | ICD-10-CM | POA: Diagnosis not present

## 2019-11-25 ENCOUNTER — Encounter: Payer: Self-pay | Admitting: Podiatry

## 2019-11-25 NOTE — Progress Notes (Signed)
Subjective:  Patient ID: Megan Bruce, female    DOB: October 06, 1960,  MRN: [id number]  Chief Complaint  Patient presents with  . Nail Problem    pt is here for a f/u of bil foot pain, pt states that she is looking to get an injection    59 y.o. female presents with the above complaint.  Patient is following up for bilateral plantar fasciitis.  Patient states is gotten better but there is still some pain that is there that is residual in nature and elevated when walking on concrete surfaces.  Patient would like to know if she could do another injection to help make the pain go away.  She denies any other acute complaints.  She has been doing her stretching etc. she has been wearing her braces.   Review of Systems: Negative except as noted in the HPI. Denies N/V/F/Ch.  Past Medical History:  Diagnosis Date  . Allergy   . GERD (gastroesophageal reflux disease)   . Hypertension   . Urge incontinence     Current Outpatient Medications:  .  Elastic Bandages & Supports (THUMB SPLINT/LEFT MEDIUM) MISC, Wear as much as possible during the day and night, Disp: 1 each, Rfl: 0 .  Elastic Bandages & Supports (THUMB SPLINT/RIGHT MEDIUM) MISC, Wear as much as possible during the day and night, Disp: 1 each, Rfl: 0 .  Olopatadine HCl 0.2 % SOLN, Apply 1 drop to eye daily., Disp: 2.5 mL, Rfl: 11  Social History   Tobacco Use  Smoking Status Never Smoker  Smokeless Tobacco Never Used    Allergies  Allergen Reactions  . Codeine     Tablet form   Objective:  There were no vitals filed for this visit. There is no height or weight on file to calculate BMI. Constitutional Well developed. Well nourished.  Vascular Dorsalis pedis pulses palpable bilaterally. Posterior tibial pulses palpable bilaterally. Capillary refill normal to all digits.  No cyanosis or clubbing noted. Pedal hair growth normal.  Neurologic Normal speech. Oriented to person, place, and time. Epicritic sensation to light  touch grossly present bilaterally.  Dermatologic Nails well groomed and normal in appearance. No open wounds. No skin lesions.  Orthopedic: Normal joint ROM without pain or crepitus bilaterally. No visible deformities. Tender to palpation at the calcaneal tuber bilaterally. No pain with calcaneal squeeze bilaterally. Ankle ROM diminished range of motion bilaterally. Silfverskiold Test: positive bilaterally.   Radiographs: Taken and reviewed. No acute fractures or dislocations. No evidence of stress fracture.  Plantar heel spur present. Posterior heel spur present.   Assessment:   1. Plantar fasciitis of right foot   2. Plantar fasciitis of left foot   3. Right foot pain   4. Left foot pain   5. Pes planovalgus    Plan:  Patient was evaluated and treated and all questions answered.  Plantar Fasciitis, bilaterally - XR reviewed as above.  - Educated on icing and stretching. Instructions given.  -Second injection delivered to the plantar fascia as below. - DME: Plantar Fascial Brace x2 - Pharmacologic management: None  Semiflexible pes planus deformity -I explained to the patient the etiology of pes planus deformity and various treatment options were discussed especially in relationship with plantar fasciitis.  I believe patient will benefit from custom-made orthotics to help support the arches of the foot and control the hindfoot motion.  Patient agrees with the plan like to proceed with getting orthotics. -Patient will be scheduled see rec for orthotics  Procedure: Injection Tendon/Ligament  x2 Location: Bilateral plantar fascia at the glabrous junction; medial approach. Skin Prep: alcohol Injectate: 0.5 cc 0.5% marcaine plain, 0.5 cc of 1% Lidocaine, 0.5 cc kenalog 10. Disposition: Patient tolerated procedure well. Injection site dressed with a band-aid.  Return for See Raiford Noble for orthotics ASAP.

## 2019-12-20 ENCOUNTER — Ambulatory Visit: Payer: 59 | Admitting: Podiatry

## 2019-12-28 ENCOUNTER — Other Ambulatory Visit: Payer: Self-pay

## 2019-12-28 ENCOUNTER — Ambulatory Visit: Payer: No Typology Code available for payment source | Admitting: Orthotics

## 2019-12-28 DIAGNOSIS — M722 Plantar fascial fibromatosis: Secondary | ICD-10-CM

## 2019-12-28 DIAGNOSIS — Q666 Other congenital valgus deformities of feet: Secondary | ICD-10-CM

## 2019-12-28 NOTE — Progress Notes (Incomplete)

## 2020-01-18 ENCOUNTER — Other Ambulatory Visit: Payer: Self-pay

## 2020-01-18 ENCOUNTER — Ambulatory Visit (INDEPENDENT_AMBULATORY_CARE_PROVIDER_SITE_OTHER): Payer: No Typology Code available for payment source | Admitting: Orthotics

## 2020-01-18 DIAGNOSIS — M722 Plantar fascial fibromatosis: Secondary | ICD-10-CM

## 2020-01-18 NOTE — Progress Notes (Signed)
Patient came in today to pick up custom made foot orthotics.  The goals were accomplished and the patient reported no dissatisfaction with said orthotics.  Patient was advised of breakin period and how to report any issues. 

## 2020-01-26 ENCOUNTER — Ambulatory Visit: Payer: 59 | Admitting: Podiatry

## 2022-07-01 ENCOUNTER — Ambulatory Visit
Admission: RE | Admit: 2022-07-01 | Discharge: 2022-07-01 | Disposition: A | Discharge status: Home / Self Care | Payer: 59 | Source: Ambulatory Visit | Attending: Physician Assistant | Admitting: Physician Assistant

## 2022-07-01 VITALS — BP 183/90 | HR 63 | Temp 98.5°F | Resp 18

## 2022-07-01 DIAGNOSIS — R197 Diarrhea, unspecified: Secondary | ICD-10-CM | POA: Diagnosis not present

## 2022-07-01 DIAGNOSIS — R051 Acute cough: Secondary | ICD-10-CM | POA: Diagnosis not present

## 2022-07-01 DIAGNOSIS — B349 Viral infection, unspecified: Secondary | ICD-10-CM

## 2022-07-01 DIAGNOSIS — R110 Nausea: Secondary | ICD-10-CM | POA: Diagnosis not present

## 2022-07-01 LAB — SARS CORONAVIRUS 2 BY RT PCR: SARS Coronavirus 2 by RT PCR: NEGATIVE

## 2022-07-01 MED ORDER — ONDANSETRON 4 MG PO TBDP: 4.0000 mg | ORAL_TABLET | Freq: Three times a day (TID) | ORAL | 0 refills | Status: DC | PRN

## 2022-07-01 MED ORDER — PROMETHAZINE-DM 6.25-15 MG/5ML PO SYRP: 5.0000 mL | ORAL_SOLUTION | Freq: Four times a day (QID) | ORAL | 0 refills | Status: DC | PRN

## 2022-07-01 MED ORDER — DIPHENOXYLATE-ATROPINE 2.5-0.025 MG PO TABS: 1.0000 | ORAL_TABLET | Freq: Four times a day (QID) | ORAL | 0 refills | Status: DC | PRN

## 2022-07-01 NOTE — Discharge Instructions (Addendum)
-  We are checking for COVID I will call you if your result is positive.  If not hear from he is negative. - You likely have a viral illness.  If you have COVID you need to wear a mask for the next 3 days.  If it is not COVID you do not necessarily have to wear a mask. - Increase your rest and fluid intake.  I have sent medication for the nausea and diarrhea as well as cough.  You should be feeling better over the next 3 to 5 days. - If you start to have a fever again, breathing difficulty or weakness need to be seen again.

## 2022-07-01 NOTE — ED Provider Notes (Signed)
[facility]    CSN: [id number] Arrival date & time: 07/01/22  1547      History   Chief Complaint No chief complaint on file.   HPI Megan Bruce is a 61 y.o. female presenting for fatigue, chills and nausea x 1 week and diarrhea, headaches, cough and sinus pressure for the past 2 days.  She denies any recorded fever but has felt feverish.  She reports some body aches that started last week.  She reports that her whole family pretty much got sick with the same symptoms around Christmas time or a couple days after.  She says no one got checked out or tested for flu or COVID.  She has taken over-the-counter cough medication and Advil but says it has not really helped.  She reports that she is post to back to work tomorrow but has been having a lot of diarrhea and does not feel that she is ready to return.  Not reporting any wheezing or breathing difficulty, vomiting or weakness.  No other complaints.  HPI  Past Medical History:  Diagnosis Date   Allergy    GERD (gastroesophageal reflux disease)    Hypertension    Urge incontinence     Patient Active Problem List   Diagnosis Date Noted   Radial styloid tenosynovitis 11/16/2017   Allergic rhinitis 09/30/2017   Allergic conjunctivitis 09/30/2017   Uterine prolapse 03/07/2017   Uterine enlargement 03/07/2017   Mild obesity 03/07/2017   Constipation 08/28/2016   Preventative health care 08/28/2016   Ovarian cystic mass 05/01/2015   Abdominal wall pain in both lower quadrants 04/19/2015   Change in bowel habits 04/19/2015   Abdominal bloating 04/19/2015   Trapezius muscle strain 01/11/2015   Frequent loose stools 01/11/2015   Occipital pain 01/11/2015   Facial asymmetry 01/11/2015   Hypertension    Urge incontinence    GERD (gastroesophageal reflux disease)     Past Surgical History:  Procedure Laterality Date   TOE SURGERY      OB History     Gravida  2   Para      Term      Preterm      AB       Living  2      SAB      IAB      Ectopic      Multiple      Live Births  2            Home Medications    Prior to Admission medications   Medication Sig Start Date End Date Taking? Authorizing Provider  diphenoxylate-atropine (LOMOTIL) 2.5-0.025 MG tablet Take 1 tablet by mouth 4 (four) times daily as needed for diarrhea or loose stools. 07/01/22  Yes Eusebio Friendly B, PA-C  ondansetron (ZOFRAN-ODT) 4 MG disintegrating tablet Take 1 tablet (4 mg total) by mouth every 8 (eight) hours as needed for nausea or vomiting. 07/01/22  Yes Shirlee Latch, PA-C  promethazine-dextromethorphan (PROMETHAZINE-DM) 6.25-15 MG/5ML syrup Take 5 mLs by mouth 4 (four) times daily as needed. 07/01/22  Yes Shirlee Latch, PA-C  Elastic Bandages & Supports (THUMB SPLINT/LEFT MEDIUM) MISC Wear as much as possible during the day and night 04/15/18   Lada, Janit Bern, MD  Elastic Bandages & Supports (THUMB SPLINT/RIGHT MEDIUM) MISC Wear as much as possible during the day and night 04/15/18   Lada, Janit Bern, MD  Olopatadine HCl 0.2 % SOLN Apply 1 drop to eye daily. 09/25/17  Arnetha Courser, MD    Family History Family History  Problem Relation Age of Onset   Stroke Mother    Hypertension Mother    Kidney disease Mother    Congestive Heart Failure Mother    Heart disease Mother        CHF   Stroke Father    Hypertension Father    Heart disease Maternal Grandmother    Cancer Neg Hx    COPD Neg Hx    Diabetes Neg Hx     Social History Social History   Tobacco Use   Smoking status: Never   Smokeless tobacco: Never  Vaping Use   Vaping Use: Never used  Substance Use Topics   Alcohol use: No   Drug use: No     Allergies   Codeine   Review of Systems Review of Systems  Constitutional:  Positive for chills and fatigue. Negative for diaphoresis and fever.  HENT:  Positive for congestion and sinus pressure. Negative for ear pain, rhinorrhea, sinus pain and sore throat.   Respiratory:   Positive for cough. Negative for shortness of breath.   Gastrointestinal:  Positive for diarrhea and nausea. Negative for abdominal pain and vomiting.  Musculoskeletal:  Positive for myalgias. Negative for arthralgias.  Skin:  Negative for rash.  Neurological:  Positive for headaches. Negative for weakness.  Hematological:  Negative for adenopathy.     Physical Exam Triage Vital Signs ED Triage Vitals  Enc Vitals Group     BP 07/01/22 1615 (!) 183/90     Pulse Rate 07/01/22 1615 63     Resp 07/01/22 1615 18     Temp 07/01/22 1615 98.5 F (36.9 C)     Temp Source 07/01/22 1615 Oral     SpO2 07/01/22 1615 98 %     Weight --      Height --      Head Circumference --      Peak Flow --      Pain Score 07/01/22 1622 0     Pain Loc --      Pain Edu? --      Excl. in [place]? --    No data found.  Updated Vital Signs BP (!) 183/90 (BP Location: Left Arm)   Pulse 63   Temp 98.5 F (36.9 C) (Oral)   Resp 18   LMP 02/20/2017   SpO2 98%   Physical Exam Vitals and nursing note reviewed.  Constitutional:      General: She is not in acute distress.    Appearance: Normal appearance. She is not ill-appearing or toxic-appearing.  HENT:     Head: Normocephalic and atraumatic.     Right Ear: Tympanic membrane, ear canal and external ear normal.     Left Ear: Tympanic membrane, ear canal and external ear normal.     Nose: Congestion present.     Mouth/Throat:     Mouth: Mucous membranes are moist.     Pharynx: Oropharynx is clear.  Eyes:     General: No scleral icterus.       Right eye: No discharge.        Left eye: No discharge.     Conjunctiva/sclera: Conjunctivae normal.  Cardiovascular:     Rate and Rhythm: Normal rate and regular rhythm.     Heart sounds: Normal heart sounds.  Pulmonary:     Effort: Pulmonary effort is normal. No respiratory distress.     Breath sounds: Normal breath sounds.  Abdominal:     Palpations: Abdomen is soft.     Tenderness: There is abdominal  tenderness (generalized).  Musculoskeletal:     Cervical back: Neck supple.  Skin:    General: Skin is dry.  Neurological:     General: No focal deficit present.     Mental Status: She is alert. Mental status is at baseline.     Motor: No weakness.     Gait: Gait normal.  Psychiatric:        Mood and Affect: Mood normal.        Behavior: Behavior normal.        Thought Content: Thought content normal.      UC Treatments / Results  Labs (all labs ordered are listed, but only abnormal results are displayed) Labs Reviewed  SARS CORONAVIRUS 2 BY RT PCR    EKG   Radiology No results found.  Procedures Procedures (including critical care time)  Medications Ordered in UC Medications - No data to display  Initial Impression / Assessment and Plan / UC Course  I have reviewed the triage vital signs and the nursing notes.  Pertinent labs & imaging results that were available during my care of the patient were reviewed by me and considered in my medical decision making (see chart for details).   62 year old female presents following history of fatigue, nausea and aches.  Reports diarrhea, cough and congestion over the past 2 days.  Reports multiple sick family members that became ill around Christmas time.  BP elevated at 183/90.  Other vitals normal and stable.  She is overall well-appearing and in no acute distress.  On exam she has nasal congestion.  Throat is clear.  Chest clear to auscultation heart regular rate and rhythm.  Abdomen is soft with generalized tenderness  PCR COVID test obtained.  Advised patient I will contact her with any positive results.  Reviewed current CDC guidelines, isolation protocol and ED precautions if positive.  Suspect viral illness.  Supportive care was encouraged.  I did send Promethazine DM, Zofran and Lomotil to the pharmacy for her.  Advised that she needs to seek reexamination if she has any weakness, fever, breathing difficulty, etc.  Work  note given.  Negative COVID.  Final Clinical Impressions(s) / UC Diagnoses   Final diagnoses:  Viral illness  Acute cough  Nausea without vomiting  Diarrhea, unspecified type     Discharge Instructions      -We are checking for COVID I will call you if your result is positive.  If not hear from he is negative. - You likely have a viral illness.  If you have COVID you need to wear a mask for the next 3 days.  If it is not COVID you do not necessarily have to wear a mask. - Increase your rest and fluid intake.  I have sent medication for the nausea and diarrhea as well as cough.  You should be feeling better over the next 3 to 5 days. - If you start to have a fever again, breathing difficulty or weakness need to be seen again.     ED Prescriptions     Medication Sig Dispense Auth. Provider   promethazine-dextromethorphan (PROMETHAZINE-DM) 6.25-15 MG/5ML syrup Take 5 mLs by mouth 4 (four) times daily as needed. 118 mL Laurene Footman B, PA-C   ondansetron (ZOFRAN-ODT) 4 MG disintegrating tablet Take 1 tablet (4 mg total) by mouth every 8 (eight) hours as needed for nausea or vomiting. 15 tablet  Shirlee Latch, PA-C   diphenoxylate-atropine (LOMOTIL) 2.5-0.025 MG tablet Take 1 tablet by mouth 4 (four) times daily as needed for diarrhea or loose stools. 20 tablet Shirlee Latch, PA-C      I have reviewed the PDMP during this encounter.   Shirlee Latch, PA-C 07/01/22 1818

## 2022-07-01 NOTE — ED Triage Notes (Signed)
Pt reports diarrhea x 2 days, nausea, headache, cough, sinus pressure x 1 week. Took tussin, theraflu and advil but no relief.

## 2023-03-18 ENCOUNTER — Encounter: Payer: Self-pay | Admitting: Family Medicine

## 2023-03-18 ENCOUNTER — Ambulatory Visit: Payer: No Typology Code available for payment source | Admitting: Family Medicine

## 2023-03-18 VITALS — BP 180/80 | HR 66 | Ht <= 58 in | Wt 166.8 lb

## 2023-03-18 DIAGNOSIS — Z1231 Encounter for screening mammogram for malignant neoplasm of breast: Secondary | ICD-10-CM | POA: Diagnosis not present

## 2023-03-18 DIAGNOSIS — K219 Gastro-esophageal reflux disease without esophagitis: Secondary | ICD-10-CM | POA: Diagnosis not present

## 2023-03-18 DIAGNOSIS — Z0000 Encounter for general adult medical examination without abnormal findings: Secondary | ICD-10-CM | POA: Diagnosis not present

## 2023-03-18 DIAGNOSIS — I10 Essential (primary) hypertension: Secondary | ICD-10-CM | POA: Diagnosis not present

## 2023-03-18 DIAGNOSIS — Z1211 Encounter for screening for malignant neoplasm of colon: Secondary | ICD-10-CM

## 2023-03-18 LAB — MICROALBUMIN, URINE WAIVED
Creatinine, Urine Waived: 300 mg/dL (ref 10–300)
Microalb, Ur Waived: 30 mg/L — ABNORMAL HIGH (ref 0–19)
Microalb/Creat Ratio: <30 mg/g (ref <30)

## 2023-03-18 MED ORDER — LISINOPRIL 10 MG PO TABS: 10.0000 mg | ORAL_TABLET | Freq: Every day | ORAL | 3 refills | Status: DC

## 2023-03-18 MED ORDER — FLUTICASONE PROPIONATE 50 MCG/ACT NA SUSP: 2.0000 | Freq: Every day | NASAL | 6 refills | Status: AC

## 2023-03-18 NOTE — Progress Notes (Signed)
BP (!) 180/80   Pulse 66   Ht 4\' 10"  (1.473 m)   Wt 166 lb 12.8 oz (75.7 kg)   LMP 02/20/2017   SpO2 99%   BMI 34.86 kg/m    Subjective:    Patient ID: Megan Bruce, female    DOB: 20-Dec-1960, 62 y.o.   MRN: [id number]  HPI: Megan Bruce is a 62 y.o. female presenting on 03/18/2023 for comprehensive medical examination and to establish care. She was seeing a provider until she retired about 4-5 years ago. She has not seen a doctor since then.  Current medical complaints include:  HYPERTENSION  Hypertension status: uncontrolled  Satisfied with current treatment? yes Duration of hypertension: chronic BP monitoring frequency:  not checking BP medication side effects:  N/A Medication compliance: N/A Previous BP meds: lisinopril Aspirin: no Recurrent headaches: no Visual changes: no Palpitations: no Dyspnea: no Chest pain: no Lower extremity edema: no Dizzy/lightheaded: no  Menopausal Symptoms: no  Depression Screen done today and results listed below:     03/18/2023   10:58 AM 04/15/2018    1:23 PM 09/25/2017   11:41 AM 08/28/2016    3:40 PM 07/18/2016    3:14 PM  Depression screen PHQ 2/9  Decreased Interest 0 0 0 0 0  Down, Depressed, Hopeless 0 0 0 0 0  PHQ - 2 Score 0 0 0 0 0  Altered sleeping 0 0     Tired, decreased energy 0 0     Change in appetite 0 0     Feeling bad or failure about yourself  0 0     Trouble concentrating 0 0     Moving slowly or fidgety/restless 0 0     Suicidal thoughts 0 0     PHQ-9 Score 0 0     Difficult doing work/chores Not difficult at all Not difficult at all       Past Medical History:  Past Medical History:  Diagnosis Date   Allergy    GERD (gastroesophageal reflux disease)    Hypertension    Ovarian cystic mass 05/01/2015   Left, [place] May 01, 2015     Urge incontinence     Surgical History:  Past Surgical History:  Procedure Laterality Date   TOE SURGERY      Medications:  Current Outpatient Medications on File  Prior to Visit  Medication Sig   omeprazole (PRILOSEC OTC) 20 MG tablet Take 20 mg by mouth daily as needed.   No current facility-administered medications on file prior to visit.    Allergies:  Allergies  Allergen Reactions   Codeine Nausea And Vomiting    Tablet form    Social History:  Social History   Socioeconomic History   Marital status: Married    Spouse name: Not on file   Number of children: Not on file   Years of education: Not on file   Highest education level: Not on file  Occupational History   Not on file  Tobacco Use   Smoking status: Never   Smokeless tobacco: Never  Vaping Use   Vaping status: Never Used  Substance and Sexual Activity   Alcohol use: No   Drug use: No   Sexual activity: Yes    Birth control/protection: None  Other Topics Concern   Not on file  Social History Narrative   Not on file   Social Determinants of Health   Financial Resource Strain: Not on file  Food Insecurity: Not on file  Transportation Needs: Not on file  Physical Activity: Not on file  Stress: Not on file  Social Connections: Not on file  Intimate Partner Violence: Not on file   Social History   Tobacco Use  Smoking Status Never  Smokeless Tobacco Never   Social History   Substance and Sexual Activity  Alcohol Use No    Family History:  Family History  Problem Relation Age of Onset   Stroke Mother    Hypertension Mother    Kidney disease Mother    Congestive Heart Failure Mother    Heart disease Mother        CHF   Stroke Father    Hypertension Father    Heart disease Maternal Grandmother    Cancer Neg Hx    COPD Neg Hx    Diabetes Neg Hx     Past medical history, surgical history, medications, allergies, family history and social history reviewed with patient today and changes made to appropriate areas of the chart.   Review of Systems  Constitutional: Negative.   HENT:  Positive for ear pain. Negative for congestion, ear discharge,  hearing loss, nosebleeds, sinus pain, sore throat and tinnitus.   Eyes: Negative.   Respiratory: Negative.  Negative for stridor.   Cardiovascular:  Positive for leg swelling (with staying on her feet long periods of time in the heat). Negative for chest pain, palpitations, orthopnea, claudication and PND.  Gastrointestinal:  Positive for constipation and heartburn. Negative for abdominal pain, blood in stool, diarrhea, melena, nausea and vomiting.  Genitourinary:  Positive for frequency. Negative for dysuria, flank pain, hematuria and urgency.  Musculoskeletal: Negative.   Skin: Negative.   Neurological: Negative.   Endo/Heme/Allergies:  Positive for environmental allergies. Negative for polydipsia. Does not bruise/bleed easily.  Psychiatric/Behavioral: Negative.     All other ROS negative except what is listed above and in the HPI.      Objective:    BP (!) 180/80   Pulse 66   Ht 4\' 10"  (1.473 m)   Wt 166 lb 12.8 oz (75.7 kg)   LMP 02/20/2017   SpO2 99%   BMI 34.86 kg/m   Wt Readings from Last 3 Encounters:  03/18/23 166 lb 12.8 oz (75.7 kg)  04/15/18 172 lb (78 kg)  09/25/17 166 lb 1.6 oz (75.3 kg)    Physical Exam Vitals and nursing note reviewed.  Constitutional:      General: She is not in acute distress.    Appearance: Normal appearance. She is obese. She is not ill-appearing, toxic-appearing or diaphoretic.  HENT:     Head: Normocephalic and atraumatic.     Right Ear: Tympanic membrane, ear canal and external ear normal. There is no impacted cerumen.     Left Ear: Tympanic membrane, ear canal and external ear normal. There is no impacted cerumen.     Nose: Nose normal. No congestion or rhinorrhea.     Mouth/Throat:     Mouth: Mucous membranes are moist.     Pharynx: Oropharynx is clear. No oropharyngeal exudate or posterior oropharyngeal erythema.  Eyes:     General: No scleral icterus.       Right eye: No discharge.        Left eye: No discharge.      Extraocular Movements: Extraocular movements intact.     Conjunctiva/sclera: Conjunctivae normal.     Pupils: Pupils are equal, round, and reactive to light.  Neck:     Vascular: No carotid bruit.  Cardiovascular:  Rate and Rhythm: Normal rate and regular rhythm.     Pulses: Normal pulses.     Heart sounds: No murmur heard.    No friction rub. No gallop.  Pulmonary:     Effort: Pulmonary effort is normal. No respiratory distress.     Breath sounds: Normal breath sounds. No stridor. No wheezing, rhonchi or rales.  Chest:     Chest wall: No tenderness.  Abdominal:     General: Abdomen is flat. Bowel sounds are normal. There is no distension.     Palpations: Abdomen is soft. There is no mass.     Tenderness: There is no abdominal tenderness. There is no right CVA tenderness, left CVA tenderness, guarding or rebound.     Hernia: No hernia is present.  Genitourinary:    Comments: Breast and pelvic exams deferred with shared decision making Musculoskeletal:        General: No swelling, tenderness, deformity or signs of injury.     Cervical back: Normal range of motion and neck supple. No rigidity. No muscular tenderness.     Right lower leg: No edema.     Left lower leg: No edema.  Lymphadenopathy:     Cervical: No cervical adenopathy.  Skin:    General: Skin is warm and dry.     Capillary Refill: Capillary refill takes less than 2 seconds.     Coloration: Skin is not jaundiced or pale.     Findings: No bruising, erythema, lesion or rash.  Neurological:     General: No focal deficit present.     Mental Status: She is alert and oriented to person, place, and time. Mental status is at baseline.     Cranial Nerves: No cranial nerve deficit.     Sensory: No sensory deficit.     Motor: No weakness.     Coordination: Coordination normal.     Gait: Gait normal.     Deep Tendon Reflexes: Reflexes normal.  Psychiatric:        Mood and Affect: Mood normal.        Behavior: Behavior  normal.        Thought Content: Thought content normal.        Judgment: Judgment normal.     Results for orders placed or performed during the hospital encounter of 07/01/22  SARS Coronavirus 2 by RT PCR (hospital order, performed in [facility] hospital lab) *cepheid single result test* Anterior Nasal Swab   Specimen: Anterior Nasal Swab  Result Value Ref Range   SARS Coronavirus 2 by RT PCR NEGATIVE NEGATIVE      Assessment & Plan:   Problem List Items Addressed This Visit       Cardiovascular and Mediastinum   Hypertension    Not under good control. Will restart lisinopril and recheck 1 month.       Relevant Medications   lisinopril (ZESTRIL) 10 MG tablet   Other Relevant Orders   Microalbumin, Urine Waived     Digestive   GERD (gastroesophageal reflux disease)    Stable on occasional omeprazole. Continue to monitor.       Relevant Medications   omeprazole (PRILOSEC OTC) 20 MG tablet   Other Visit Diagnoses     Routine general medical examination at a health care facility    -  Primary   Vaccines declined. Screening labs checked today. Pap next visit. Mammo and cologuard ordered. Continue diet and exercise. Call with any concerns.   Relevant Orders  CBC with Differential/Platelet   Comprehensive metabolic panel   Lipid Panel w/o Chol/HDL Ratio   TSH   HIV Antibody (routine testing w rflx)   Hepatitis C Antibody   Hgb A1c w/o eAG   Encounter for screening mammogram for malignant neoplasm of breast       Mammogram scheduled.   Relevant Orders   MM 3D SCREENING MAMMOGRAM BILATERAL BREAST   Screening for colon cancer       Cologuard ordered today.   Relevant Orders   Cologuard        Follow up plan: Return in about 4 weeks (around 04/15/2023) for BP and pap .   LABORATORY TESTING:  - Pap smear: will do next visit  IMMUNIZATIONS:   - Tdap: Tetanus vaccination status reviewed: last tetanus booster within 10 years. - Influenza: Refused - Pneumovax:  Not applicable - Prevnar: Not applicable - COVID: Refused - HPV: Not applicable - Shingrix vaccine: Refused  SCREENING: -Mammogram: Ordered today  - Colonoscopy: cologuard ordered today   PATIENT COUNSELING:   Advised to take 1 mg of folate supplement per day if capable of pregnancy.   Sexuality: Discussed sexually transmitted diseases, partner selection, use of condoms, avoidance of unintended pregnancy  and contraceptive alternatives.   Advised to avoid cigarette smoking.  I discussed with the patient that most people either abstain from alcohol or drink within safe limits (<=14/week and <=4 drinks/occasion for males, <=7/weeks and <= 3 drinks/occasion for females) and that the risk for alcohol disorders and other health effects rises proportionally with the number of drinks per week and how often a drinker exceeds daily limits.  Discussed cessation/primary prevention of drug use and availability of treatment for abuse.   Diet: Encouraged to adjust caloric intake to maintain  or achieve ideal body weight, to reduce intake of dietary saturated fat and total fat, to limit sodium intake by avoiding high sodium foods and not adding table salt, and to maintain adequate dietary potassium and calcium preferably from fresh fruits, vegetables, and low-fat dairy products.    stressed the importance of regular exercise  Injury prevention: Discussed safety belts, safety helmets, smoke detector, smoking near bedding or upholstery.   Dental health: Discussed importance of regular tooth brushing, flossing, and dental visits.    NEXT PREVENTATIVE PHYSICAL DUE IN 1 YEAR. Return in about 4 weeks (around 04/15/2023) for BP and pap .

## 2023-03-18 NOTE — Assessment & Plan Note (Signed)
Not under good control. Will restart lisinopril and recheck 1 month.

## 2023-03-18 NOTE — Patient Instructions (Signed)
Patient has a Screening Mammogram scheduled for Wednesday October 9th at 10:00 at [facility]. If patient has any questions or concerns regarding her scheduled appointment. Patient can reach out to their facility directly at [phone].

## 2023-03-18 NOTE — Assessment & Plan Note (Signed)
Stable on occasional omeprazole. Continue to monitor.

## 2023-03-19 LAB — CBC WITH DIFFERENTIAL/PLATELET
Basophils Absolute: 0 10*3/uL (ref 0.0–0.2)
Basos: 1 %
EOS (ABSOLUTE): 0.1 10*3/uL (ref 0.0–0.4)
Eos: 3 %
Hematocrit: 45.9 % (ref 34.0–46.6)
Hemoglobin: 15.2 g/dL (ref 11.1–15.9)
Immature Grans (Abs): 0 10*3/uL (ref 0.0–0.1)
Immature Granulocytes: 0 %
Lymphocytes Absolute: 1.7 10*3/uL (ref 0.7–3.1)
Lymphs: 38 %
MCH: 31.8 pg (ref 26.6–33.0)
MCHC: 33.1 g/dL (ref 31.5–35.7)
MCV: 96 fL (ref 79–97)
Monocytes Absolute: 0.3 10*3/uL (ref 0.1–0.9)
Monocytes: 6 %
Neutrophils Absolute: 2.4 10*3/uL (ref 1.4–7.0)
Neutrophils: 52 %
Platelets: 273 10*3/uL (ref 150–450)
RBC: 4.78 x10E6/uL (ref 3.77–5.28)
RDW: 12.5 % (ref 11.7–15.4)
WBC: 4.6 10*3/uL (ref 3.4–10.8)

## 2023-03-19 LAB — COMPREHENSIVE METABOLIC PANEL
ALT: 20 IU/L (ref 0–32)
AST: 20 IU/L (ref 0–40)
Albumin: 4.2 g/dL (ref 3.8–4.9)
Alkaline Phosphatase: 95 IU/L (ref 44–121)
BUN/Creatinine Ratio: 20 (ref 12–28)
BUN: 19 mg/dL (ref 8–27)
Bilirubin Total: 0.7 mg/dL (ref 0.0–1.2)
CO2: 20 mmol/L (ref 20–29)
Calcium: 9.3 mg/dL (ref 8.7–10.3)
Chloride: 104 mmol/L (ref 96–106)
Creatinine, Ser: 0.97 mg/dL (ref 0.57–1.00)
Globulin, Total: 2.6 g/dL (ref 1.5–4.5)
Glucose: 92 mg/dL (ref 70–99)
Potassium: 4.3 mmol/L (ref 3.5–5.2)
Sodium: 141 mmol/L (ref 134–144)
Total Protein: 6.8 g/dL (ref 6.0–8.5)
eGFR: 67 mL/min/{1.73_m2} (ref >59)

## 2023-03-19 LAB — LIPID PANEL W/O CHOL/HDL RATIO
Cholesterol, Total: 179 mg/dL (ref 100–199)
HDL: 61 mg/dL (ref >39)
LDL Chol Calc (NIH): 99 mg/dL (ref 0–99)
Triglycerides: 105 mg/dL (ref 0–149)
VLDL Cholesterol Cal: 19 mg/dL (ref 5–40)

## 2023-03-19 LAB — HEPATITIS C ANTIBODY: Hep C Virus Ab: NONREACTIVE

## 2023-03-19 LAB — HIV ANTIBODY (ROUTINE TESTING W REFLEX): HIV Screen 4th Generation wRfx: NONREACTIVE

## 2023-03-19 LAB — TSH: TSH: 1.01 u[IU]/mL (ref 0.450–4.500)

## 2023-03-19 LAB — HGB A1C W/O EAG: Hgb A1c MFr Bld: 5.1 % (ref 4.8–5.6)

## 2023-03-21 ENCOUNTER — Encounter: Payer: Self-pay | Admitting: Family Medicine

## 2023-04-08 ENCOUNTER — Ambulatory Visit: Payer: 59

## 2023-04-16 ENCOUNTER — Ambulatory Visit: Payer: No Typology Code available for payment source | Admitting: Family Medicine

## 2023-04-16 ENCOUNTER — Encounter: Payer: Self-pay | Admitting: Family Medicine

## 2023-04-16 ENCOUNTER — Other Ambulatory Visit (HOSPITAL_COMMUNITY)
Admission: RE | Admit: 2023-04-16 | Discharge: 2023-04-16 | Disposition: A | Discharge status: Home / Self Care | Payer: No Typology Code available for payment source | Source: Ambulatory Visit | Attending: Family Medicine | Admitting: Family Medicine

## 2023-04-16 VITALS — BP 134/76 | HR 61 | Ht <= 58 in | Wt 165.6 lb

## 2023-04-16 DIAGNOSIS — R350 Frequency of micturition: Secondary | ICD-10-CM | POA: Diagnosis not present

## 2023-04-16 DIAGNOSIS — Z124 Encounter for screening for malignant neoplasm of cervix: Secondary | ICD-10-CM | POA: Insufficient documentation

## 2023-04-16 DIAGNOSIS — I10 Essential (primary) hypertension: Secondary | ICD-10-CM | POA: Diagnosis not present

## 2023-04-16 DIAGNOSIS — R8281 Pyuria: Secondary | ICD-10-CM

## 2023-04-16 LAB — MICROSCOPIC EXAMINATION: Bacteria, UA: NONE SEEN

## 2023-04-16 LAB — URINALYSIS, ROUTINE W REFLEX MICROSCOPIC
Bilirubin, UA: NEGATIVE
Glucose, UA: NEGATIVE
Ketones, UA: NEGATIVE
Nitrite, UA: NEGATIVE
Protein,UA: NEGATIVE
Specific Gravity, UA: 1.025 (ref 1.005–1.030)
Urobilinogen, Ur: 0.2 mg/dL (ref 0.2–1.0)
pH, UA: 5.5 (ref 5.0–7.5)

## 2023-04-16 MED ORDER — LISINOPRIL 10 MG PO TABS: 10.0000 mg | ORAL_TABLET | Freq: Every day | ORAL | 1 refills | Status: DC

## 2023-04-16 NOTE — Patient Instructions (Signed)
Please call to schedule your mammogram and/or bone density: [facility]  Address: [address], [place], [place] [address] Phone: [phone]  [facility] [address] [place],  [place]  [address] Phone: [phone]

## 2023-04-16 NOTE — Assessment & Plan Note (Addendum)
Under good control on current regimen. Continue current regimen. Continue to monitor. Call with any concerns. Refills given. Labs drawn today.   

## 2023-04-16 NOTE — Progress Notes (Signed)
BP 134/76   Pulse 61   Ht 4\' 10"  (1.473 m)   Wt 165 lb 9.6 oz (75.1 kg)   LMP 02/20/2017   SpO2 96%   BMI 34.61 kg/m    Subjective:    Patient ID: Megan Bruce, female    DOB: [birth year], 62 y.o.   MRN: [id number]  HPI: Megan Bruce is a 62 y.o. female  Chief Complaint  Patient presents with   Hypertension   PAP     HYPERTENSION  Hypertension status: stable  Satisfied with current treatment? yes Duration of hypertension: chronic BP monitoring frequency:  not checking BP medication side effects:  no Medication compliance: excellent compliance Previous BP meds:lisinopril Aspirin: no Recurrent headaches: no Visual changes: no Palpitations: no Dyspnea: no Chest pain: no Lower extremity edema: no Dizzy/lightheaded: no  Relevant past medical, surgical, family and social history reviewed and updated as indicated. Interim medical history since our last visit reviewed. Allergies and medications reviewed and updated.  Review of Systems  Constitutional: Negative.   Respiratory: Negative.    Cardiovascular: Negative.   Gastrointestinal: Negative.   Genitourinary:  Positive for frequency. Negative for decreased urine volume, difficulty urinating, dyspareunia, dysuria, enuresis, flank pain, genital sores, hematuria, menstrual problem, pelvic pain, urgency, vaginal bleeding, vaginal discharge and vaginal pain.  Musculoskeletal: Negative.   Psychiatric/Behavioral: Negative.      Per HPI unless specifically indicated above     Objective:    BP 134/76   Pulse 61   Ht 4\' 10"  (1.473 m)   Wt 165 lb 9.6 oz (75.1 kg)   LMP 02/20/2017   SpO2 96%   BMI 34.61 kg/m   Wt Readings from Last 3 Encounters:  04/16/23 165 lb 9.6 oz (75.1 kg)  03/18/23 166 lb 12.8 oz (75.7 kg)  04/15/18 172 lb (78 kg)    Physical Exam Vitals and nursing note reviewed. Exam conducted with a chaperone present.  Constitutional:      General: She is not in acute distress.    Appearance: Normal  appearance. She is not ill-appearing, toxic-appearing or diaphoretic.  HENT:     Head: Normocephalic and atraumatic.     Right Ear: External ear normal.     Left Ear: External ear normal.     Nose: Nose normal.     Mouth/Throat:     Mouth: Mucous membranes are moist.     Pharynx: Oropharynx is clear.  Eyes:     General: No scleral icterus.       Right eye: No discharge.        Left eye: No discharge.     Extraocular Movements: Extraocular movements intact.     Conjunctiva/sclera: Conjunctivae normal.     Pupils: Pupils are equal, round, and reactive to light.  Cardiovascular:     Rate and Rhythm: Normal rate and regular rhythm.     Pulses: Normal pulses.     Heart sounds: Normal heart sounds. No murmur heard.    No friction rub. No gallop.  Pulmonary:     Effort: Pulmonary effort is normal. No respiratory distress.     Breath sounds: Normal breath sounds. No stridor. No wheezing, rhonchi or rales.  Chest:     Chest wall: No tenderness.  Abdominal:     Hernia: There is no hernia in the left inguinal area or right inguinal area.  Genitourinary:    Exam position: Knee-chest position.     Pubic Area: No rash or pubic lice.  Labia:        Right: No rash, tenderness, lesion or injury.        Left: No rash, tenderness, lesion or injury.      Urethra: Prolapse present.     Vagina: No signs of injury and foreign body. Prolapsed vaginal walls present. No vaginal discharge, erythema, tenderness, bleeding or lesions.     Cervix: Normal.     Uterus: Normal.      Adnexa: Right adnexa normal and left adnexa normal.  Musculoskeletal:        General: Normal range of motion.     Cervical back: Normal range of motion and neck supple.  Lymphadenopathy:     Lower Body: No right inguinal adenopathy. No left inguinal adenopathy.  Skin:    General: Skin is warm and dry.     Capillary Refill: Capillary refill takes less than 2 seconds.     Coloration: Skin is not jaundiced or pale.      Findings: No bruising, erythema, lesion or rash.  Neurological:     General: No focal deficit present.     Mental Status: She is alert and oriented to person, place, and time. Mental status is at baseline.  Psychiatric:        Mood and Affect: Mood normal.        Behavior: Behavior normal.        Thought Content: Thought content normal.        Judgment: Judgment normal.     Results for orders placed or performed in visit on 03/18/23  CBC with Differential/Platelet  Result Value Ref Range   WBC 4.6 3.4 - 10.8 x10E3/uL   RBC 4.78 3.77 - 5.28 x10E6/uL   Hemoglobin 15.2 11.1 - 15.9 g/dL   Hematocrit 88.4 16.6 - 46.6 %   MCV 96 79 - 97 fL   MCH 31.8 26.6 - 33.0 pg   MCHC 33.1 31.5 - 35.7 g/dL   RDW 06.3 01.6 - 01.0 %   Platelets 273 150 - 450 x10E3/uL   Neutrophils 52 Not Estab. %   Lymphs 38 Not Estab. %   Monocytes 6 Not Estab. %   Eos 3 Not Estab. %   Basos 1 Not Estab. %   Neutrophils Absolute 2.4 1.4 - 7.0 x10E3/uL   Lymphocytes Absolute 1.7 0.7 - 3.1 x10E3/uL   Monocytes Absolute 0.3 0.1 - 0.9 x10E3/uL   EOS (ABSOLUTE) 0.1 0.0 - 0.4 x10E3/uL   Basophils Absolute 0.0 0.0 - 0.2 x10E3/uL   Immature Granulocytes 0 Not Estab. %   Immature Grans (Abs) 0.0 0.0 - 0.1 x10E3/uL  Comprehensive metabolic panel  Result Value Ref Range   Glucose 92 70 - 99 mg/dL   BUN 19 8 - 27 mg/dL   Creatinine, Ser 9.32 0.57 - 1.00 mg/dL   eGFR 67 >35 TD/DUK/0.25   BUN/Creatinine Ratio 20 12 - 28   Sodium 141 134 - 144 mmol/L   Potassium 4.3 3.5 - 5.2 mmol/L   Chloride 104 96 - 106 mmol/L   CO2 20 20 - 29 mmol/L   Calcium 9.3 8.7 - 10.3 mg/dL   Total Protein 6.8 6.0 - 8.5 g/dL   Albumin 4.2 3.8 - 4.9 g/dL   Globulin, Total 2.6 1.5 - 4.5 g/dL   Bilirubin Total 0.7 0.0 - 1.2 mg/dL   Alkaline Phosphatase 95 44 - 121 IU/L   AST 20 0 - 40 IU/L   ALT 20 0 - 32 IU/L  Lipid Panel w/o Chol/HDL  Ratio  Result Value Ref Range   Cholesterol, Total 179 100 - 199 mg/dL   Triglycerides 782 0 - 149  mg/dL   HDL 61 >95 mg/dL   VLDL Cholesterol Cal 19 5 - 40 mg/dL   LDL Chol Calc (NIH) 99 0 - 99 mg/dL  TSH  Result Value Ref Range   TSH 1.010 0.450 - 4.500 uIU/mL  Microalbumin, Urine Waived  Result Value Ref Range   Microalb, Ur Waived 30 (H) 0 - 19 mg/L   Creatinine, Urine Waived 300 10 - 300 mg/dL   Microalb/Creat Ratio <30 <30 mg/g  HIV Antibody (routine testing w rflx)  Result Value Ref Range   HIV Screen 4th Generation wRfx Non Reactive Non Reactive  Hepatitis C Antibody  Result Value Ref Range   Hep C Virus Ab Non Reactive Non Reactive  Hgb A1c w/o eAG  Result Value Ref Range   Hgb A1c MFr Bld 5.1 4.8 - 5.6 %      Assessment & Plan:   Problem List Items Addressed This Visit       Cardiovascular and Mediastinum   Hypertension - Primary    Under good control on current regimen. Continue current regimen. Continue to monitor. Call with any concerns. Refills given.  Labs drawn today.      Relevant Medications   lisinopril (ZESTRIL) 10 MG tablet   Other Relevant Orders   Basic metabolic panel   Other Visit Diagnoses     Screening for cervical cancer       Pap done today. Await results.   Relevant Orders   Cytology - PAP   Urinary frequency       + leuks- will send for culture and treat as needed.   Relevant Orders   Urinalysis, Routine w reflex microscopic   Pyuria       + leuks- will send for culture and treat as needed.   Relevant Orders   Urine Culture        Follow up plan: Return in about 6 months (around 10/15/2023).

## 2023-04-17 LAB — BASIC METABOLIC PANEL
BUN/Creatinine Ratio: 19 (ref 12–28)
BUN: 18 mg/dL (ref 8–27)
CO2: 19 mmol/L — ABNORMAL LOW (ref 20–29)
Calcium: 9.2 mg/dL (ref 8.7–10.3)
Chloride: 102 mmol/L (ref 96–106)
Creatinine, Ser: 0.96 mg/dL (ref 0.57–1.00)
Glucose: 79 mg/dL (ref 70–99)
Potassium: 4.2 mmol/L (ref 3.5–5.2)
Sodium: 139 mmol/L (ref 134–144)
eGFR: 68 mL/min/{1.73_m2} (ref >59)

## 2023-04-19 LAB — URINE CULTURE

## 2023-04-20 ENCOUNTER — Encounter: Payer: Self-pay | Admitting: Family Medicine

## 2023-04-22 LAB — CYTOLOGY - PAP
Comment: NEGATIVE
Diagnosis: NEGATIVE
High risk HPV: NEGATIVE

## 2023-04-22 NOTE — Progress Notes (Signed)
Printed and mailed

## 2023-04-27 NOTE — Progress Notes (Signed)
Printed and mailed

## 2023-09-23 ENCOUNTER — Ambulatory Visit: Payer: Self-pay | Admitting: *Deleted

## 2023-09-23 NOTE — Telephone Encounter (Signed)
 Copied from CRM [id number]. Topic: Clinical - Red Word Triage >> Sep 23, 2023 12:40 PM Patsy Lager T wrote: Red Word that prompted transfer to Nurse Triage: patient said she woke up at 3am dizzy, nauseous and right ear pain. Reason for Disposition  Earache  (Exceptions: brief ear pain of < 60 minutes duration, earache occurring during air travel  Answer Assessment - Initial Assessment Questions 1. LOCATION: "Which ear is involved?"     I'm having pain in my right ear that started 2 weeks ago.   My mouth was hurting and the x rays of my teeth were fine.   I got better then last night I woke up dizzy and having nausea. 2. ONSET: "When did the ear start hurting"      This morning I was still having right ear pain and dizziness.   I put hydrogen peroxide in it.   That cleaned it.   I'm still having some dizziness and nausea.    3. SEVERITY: "How bad is the pain?"  (Scale 1-10; mild, moderate or severe)   - MILD (1-3): doesn't interfere with normal activities    - MODERATE (4-7): interferes with normal activities or awakens from sleep    - SEVERE (8-10): excruciating pain, unable to do any normal activities      Mild   The ear pain is not so bad now.   I've had this problem before.    I have sinus problems.  4. URI SYMPTOMS: "Do you have a runny nose or cough?"     No runny nose or fever or sore throat. 5. FEVER: "Do you have a fever?" If Yes, ask: "What is your temperature, how was it measured, and when did it start?"     No 6. CAUSE: "Have you been swimming recently?", "How often do you use Q-TIPS?", "Have you had any recent air travel or scuba diving?"     I used a Q tip to clean my right ear this morning. 7. OTHER SYMPTOMS: "Do you have any other symptoms?" (e.g., headache, stiff neck, dizziness, vomiting, runny nose, decreased hearing)     Dizziness and nausea 8. PREGNANCY: "Is there any chance you are pregnant?" "When was your last menstrual period?"     N/A due to age  Protocols used:  Earache-A-AH  Chief Complaint: Right ear pain with nausea and dizziness that started about 3:00 AM this morning. Symptoms: Still having some nausea today with mild dizziness Frequency: Since last week.   It went away but came back this morning around 3:00 AM. Pertinent Negatives: Patient denies fever or drainage from ear.   No other URI symptoms Disposition: [] ED /[] Urgent Care (no appt availability in office) / [x] Appointment(In office/virtual)/ []  [facility]/ [] Home Care/ [] Refused Recommended Disposition /[] [facility]/ []  Follow-up with PCP Additional Notes: Appt made for 09/24/2023.

## 2023-09-24 ENCOUNTER — Ambulatory Visit: Admitting: Nurse Practitioner

## 2023-09-24 ENCOUNTER — Encounter: Payer: Self-pay | Admitting: Nurse Practitioner

## 2023-09-24 VITALS — BP 155/75 | HR 63 | Ht 59.0 in | Wt 165.2 lb

## 2023-09-24 DIAGNOSIS — H6993 Unspecified Eustachian tube disorder, bilateral: Secondary | ICD-10-CM

## 2023-09-24 NOTE — Progress Notes (Signed)
 BP (!) 155/75 (BP Location: Left Arm, Patient Position: Sitting, Cuff Size: Large)   Pulse 63   Ht 4\' 11"  (1.499 m)   Wt 165 lb 3.2 oz (74.9 kg)   LMP 02/20/2017   SpO2 98%   BMI 33.37 kg/m    Subjective:    Patient ID: Megan Bruce, female    DOB: [birth year], 63 y.o.   MRN: [id number]  HPI: Megan Bruce is a 63 y.o. female  Chief Complaint  Patient presents with   Ear Pain    Right ear, felt like equilibrium was off, room was spinning    Dizziness   EAR PAIN Dizziness and nausea was yesterday but did resolve. Used some peroxide yesterday. Duration: days Involved ear(s): right Severity:  mild  Quality:  sore Fever: no Otorrhea: no Upper respiratory infection symptoms: no Pruritus: no Hearing loss: no Water immersion no Using Q-tips: no Recurrent otitis media: no Status: stable Treatments attempted: none  Relevant past medical, surgical, family and social history reviewed and updated as indicated. Interim medical history since our last visit reviewed. Allergies and medications reviewed and updated.  Review of Systems  HENT:  Positive for ear pain. Negative for congestion.   Respiratory:  Negative for cough.     Per HPI unless specifically indicated above     Objective:    BP (!) 155/75 (BP Location: Left Arm, Patient Position: Sitting, Cuff Size: Large)   Pulse 63   Ht 4\' 11"  (1.499 m)   Wt 165 lb 3.2 oz (74.9 kg)   LMP 02/20/2017   SpO2 98%   BMI 33.37 kg/m   Wt Readings from Last 3 Encounters:  09/24/23 165 lb 3.2 oz (74.9 kg)  04/16/23 165 lb 9.6 oz (75.1 kg)  03/18/23 166 lb 12.8 oz (75.7 kg)    Physical Exam Vitals and nursing note reviewed.  Constitutional:      General: She is not in acute distress.    Appearance: Normal appearance. She is normal weight. She is not ill-appearing, toxic-appearing or diaphoretic.  HENT:     Head: Normocephalic.     Right Ear: External ear normal. A middle ear effusion is present.     Left Ear: External  ear normal. A middle ear effusion is present.     Nose: Nose normal.     Mouth/Throat:     Mouth: Mucous membranes are moist.     Pharynx: Oropharynx is clear.  Eyes:     General:        Right eye: No discharge.        Left eye: No discharge.     Extraocular Movements: Extraocular movements intact.     Conjunctiva/sclera: Conjunctivae normal.     Pupils: Pupils are equal, round, and reactive to light.  Cardiovascular:     Rate and Rhythm: Normal rate and regular rhythm.     Heart sounds: No murmur heard. Pulmonary:     Effort: Pulmonary effort is normal. No respiratory distress.     Breath sounds: Normal breath sounds. No wheezing or rales.  Musculoskeletal:     Cervical back: Normal range of motion and neck supple.  Skin:    General: Skin is warm and dry.     Capillary Refill: Capillary refill takes less than 2 seconds.  Neurological:     General: No focal deficit present.     Mental Status: She is alert and oriented to person, place, and time. Mental status is at baseline.  Psychiatric:  Mood and Affect: Mood normal.        Behavior: Behavior normal.        Thought Content: Thought content normal.        Judgment: Judgment normal.     Results for orders placed or performed in visit on 04/16/23  Cytology - PAP   Collection Time: 04/16/23  3:19 PM  Result Value Ref Range   High risk HPV Negative    Adequacy Satisfactory for evaluation.    Diagnosis      - Negative for intraepithelial lesion or malignancy (NILM)   Comment Normal Reference Range HPV - Negative   Microscopic Examination   Collection Time: 04/16/23  3:43 PM   Urine  Result Value Ref Range   WBC, UA 0-5 0 - 5 /hpf   RBC, Urine 0-2 0 - 2 /hpf   Epithelial Cells (non renal) 0-10 0 - 10 /hpf   Bacteria, UA None seen None seen/Few  Urinalysis, Routine w reflex microscopic   Collection Time: 04/16/23  3:43 PM  Result Value Ref Range   Specific Gravity, UA 1.025 1.005 - 1.030   pH, UA 5.5 5.0 - 7.5    Color, UA Yellow Yellow   Appearance Ur Clear Clear   Leukocytes,UA Trace (A) Negative   Protein,UA Negative Negative/Trace   Glucose, UA Negative Negative   Ketones, UA Negative Negative   RBC, UA Trace (A) Negative   Bilirubin, UA Negative Negative   Urobilinogen, Ur 0.2 0.2 - 1.0 mg/dL   Nitrite, UA Negative Negative   Microscopic Examination See below:   Basic metabolic panel   Collection Time: 04/16/23  3:48 PM  Result Value Ref Range   Glucose 79 70 - 99 mg/dL   BUN 18 8 - 27 mg/dL   Creatinine, Ser 6.57 0.57 - 1.00 mg/dL   eGFR 68 >84 ON/GEX/5.28   BUN/Creatinine Ratio 19 12 - 28   Sodium 139 134 - 144 mmol/L   Potassium 4.2 3.5 - 5.2 mmol/L   Chloride 102 96 - 106 mmol/L   CO2 19 (L) 20 - 29 mmol/L   Calcium 9.2 8.7 - 10.3 mg/dL  Urine Culture   Collection Time: 04/17/23  8:14 AM   Specimen: Urine   UR  Result Value Ref Range   Urine Culture, Routine Final report    Organism ID, Bacteria Comment       Assessment & Plan:   Problem List Items Addressed This Visit   None Visit Diagnoses       Dysfunction of both eustachian tubes    -  Primary   Recommend antihistamine nightly for 2 weeks.  Follow up if symptoms not improved.        Follow up plan: No follow-ups on file.

## 2023-09-29 ENCOUNTER — Ambulatory Visit: Payer: Self-pay

## 2023-09-29 MED ORDER — AMOXICILLIN 500 MG PO CAPS: 500.0000 mg | ORAL_CAPSULE | Freq: Two times a day (BID) | ORAL | 0 refills | Status: DC

## 2023-09-29 MED ORDER — AMOXICILLIN 500 MG PO CAPS: 500.0000 mg | ORAL_CAPSULE | Freq: Two times a day (BID) | ORAL | 0 refills | Status: AC

## 2023-09-29 NOTE — Telephone Encounter (Signed)
 Copied from CRM [id number]. Topic: Clinical - Red Word Triage >> Sep 29, 2023  1:50 PM Marland Kitchen D wrote: Seen Megan Bruce last Thursday for ear pain she's been dizzy and nauesous and it not getting any better ear Is still sore. Says she was told to keep taking Zertex.   Chief Complaint: Right ear pain Symptoms: pain, nausea, dizziness Frequency: 6 days Pertinent Negatives: Patient denies fever, chest pain, difficulty breathing, vomiting, diarrhea,  Disposition: [] ED /[] Urgent Care (no appt availability in office) / [] Appointment(In office/virtual)/ []  [facility]/ [] Home Care/ [x] Refused Recommended Disposition /[] [place] [facility]/ []  Follow-up with PCP Additional Notes: Patient called and advised that she came in to the office 09/24/2023 and was advised at that time to just take Zyrtec every night but patient states she is not getting any better. Patient had issues with her right ear a month ago. Patient was asked if this RN could ask about her dizziness and patient states that her dizziness did not start until her ear started hurting. Patient frustrated about not feeling any better. Patient does not want to go to any appointments or go see any specialists at this time.  Patient is frustrated that she did not get an antibiotic at her last visit. Patient was advised that this message would be sent to her provider and someone would be back in touch with her.  Care Advice was given as per protocol. This RN advised her that I would send this message over to her PCP's nurse and if an appointment was required for follow up someone would contact her and patient was frustrated about that being a possibility and just wanted her PCP to get this information about her ear still bothering her and seeing about getting an antibiotic, some ear drops, or even over the counter ear drops.  This RN stated that I wold relay this message and patient hung up at this time.   Reason for Disposition  Earache   (Exceptions: brief ear pain of < 60 minutes duration, earache occurring during air travel  Answer Assessment - Initial Assessment Questions 1. LOCATION: "Which ear is involved?"     Right ear pain 2. ONSET: "When did the ear start hurting"      6 days ago 3. SEVERITY: "How bad is the pain?"  (Scale 1-10; mild, moderate or severe)   - MILD (1-3): doesn't interfere with normal activities    - MODERATE (4-7): interferes with normal activities or awakens from sleep    - SEVERE (8-10): excruciating pain, unable to do any normal activities      7 4. URI SYMPTOMS: "Do you have a runny nose or cough?"     No 5. FEVER: "Do you have a fever?" If Yes, ask: "What is your temperature, how was it measured, and when did it start?"     No 6. CAUSE: "Have you been swimming recently?", "How often do you use Q-TIPS?", "Have you had any recent air travel or scuba diving?"     N/a 7. OTHER SYMPTOMS: "Do you have any other symptoms?" (e.g., headache, stiff neck, dizziness, vomiting, runny nose, decreased hearing)     Dizziness, nausea  Protocols used: Earache-A-AH

## 2023-09-29 NOTE — Telephone Encounter (Signed)
Called and LVM notifying patient of Karen's message.

## 2023-09-29 NOTE — Telephone Encounter (Signed)
 Amoxicillin twice a day for 7 days sent to [facility].  If symptoms are not improved, patient needs another appt.

## 2023-09-29 NOTE — Telephone Encounter (Signed)
 Routing to provider who saw the patient.

## 2023-09-30 ENCOUNTER — Telehealth: Payer: Self-pay

## 2023-09-30 NOTE — Telephone Encounter (Signed)
 Spoke with pharmacist and relied verbal prescription for Amoxicillin.

## 2023-09-30 NOTE — Telephone Encounter (Signed)
 Contacted [facility], spoke with the pharmacist and he stated that they have the prescription for Amoxicillin.   Called and LVM notifying patient that the pharmacy states that they have the prescription and are getting it ready for her.

## 2023-09-30 NOTE — Telephone Encounter (Signed)
 Copied from CRM [id number]. Topic: Clinical - Prescription Issue >> Sep 30, 2023  8:59 AM Gery Pray wrote: Reason for CRM: Patient called and stated she called the pharmacy this morning 04/02 regarding her antibiotic but they stated that have not received a prescription for her yet. Please contact patient at [phone]

## 2023-10-15 ENCOUNTER — Encounter: Payer: Self-pay | Admitting: Family Medicine

## 2023-10-15 ENCOUNTER — Ambulatory Visit: Payer: Self-pay | Admitting: Family Medicine

## 2023-10-15 VITALS — BP 148/90 | HR 52 | Ht 59.0 in | Wt 167.0 lb

## 2023-10-15 DIAGNOSIS — I10 Essential (primary) hypertension: Secondary | ICD-10-CM | POA: Diagnosis not present

## 2023-10-15 MED ORDER — LISINOPRIL 20 MG PO TABS: 20.0000 mg | ORAL_TABLET | Freq: Every day | ORAL | 0 refills | Status: DC

## 2023-10-15 MED ORDER — OLOPATADINE HCL 0.1 % OP SOLN: 1.0000 [drp] | Freq: Two times a day (BID) | OPHTHALMIC | 12 refills | Status: AC

## 2023-10-15 NOTE — Progress Notes (Signed)
 BP (!) 148/90   Pulse (!) 52   Ht 4\' 11"  (1.499 m)   Wt 167 lb (75.8 kg)   LMP 02/20/2017   SpO2 97%   BMI 33.73 kg/m    Subjective:    Patient ID: Megan Bruce, female    DOB: [birth year], 63 y.o.   MRN: [id number]  HPI: Megan Bruce is a 63 y.o. female  Chief Complaint  Patient presents with   Hypertension   HYPERTENSION Hypertension status: controlled  Satisfied with current treatment? yes Duration of hypertension: chronic BP monitoring frequency:  not checking BP medication side effects:  no Medication compliance: excellent compliance Previous BP meds: lisinopril Aspirin: no Recurrent headaches: no Visual changes: no Palpitations: no Dyspnea: no Chest pain: no Lower extremity edema: no Dizzy/lightheaded: no   Relevant past medical, surgical, family and social history reviewed and updated as indicated. Interim medical history since our last visit reviewed. Allergies and medications reviewed and updated.  Review of Systems  Constitutional: Negative.   Respiratory: Negative.    Cardiovascular: Negative.   Gastrointestinal: Negative.   Musculoskeletal: Negative.   Psychiatric/Behavioral: Negative.      Per HPI unless specifically indicated above     Objective:    BP (!) 148/90   Pulse (!) 52   Ht 4\' 11"  (1.499 m)   Wt 167 lb (75.8 kg)   LMP 02/20/2017   SpO2 97%   BMI 33.73 kg/m   Wt Readings from Last 3 Encounters:  10/15/23 167 lb (75.8 kg)  09/24/23 165 lb 3.2 oz (74.9 kg)  04/16/23 165 lb 9.6 oz (75.1 kg)    Physical Exam Vitals and nursing note reviewed.  Constitutional:      General: She is not in acute distress.    Appearance: Normal appearance. She is obese. She is not ill-appearing, toxic-appearing or diaphoretic.  HENT:     Head: Normocephalic and atraumatic.     Right Ear: External ear normal.     Left Ear: External ear normal.     Nose: Nose normal.     Mouth/Throat:     Mouth: Mucous membranes are moist.     Pharynx:  Oropharynx is clear.  Eyes:     General: No scleral icterus.       Right eye: No discharge.        Left eye: No discharge.     Extraocular Movements: Extraocular movements intact.     Conjunctiva/sclera: Conjunctivae normal.     Pupils: Pupils are equal, round, and reactive to light.  Cardiovascular:     Rate and Rhythm: Normal rate and regular rhythm.     Pulses: Normal pulses.     Heart sounds: Normal heart sounds. No murmur heard.    No friction rub. No gallop.  Pulmonary:     Effort: Pulmonary effort is normal. No respiratory distress.     Breath sounds: Normal breath sounds. No stridor. No wheezing, rhonchi or rales.  Chest:     Chest wall: No tenderness.  Musculoskeletal:        General: Normal range of motion.     Cervical back: Normal range of motion and neck supple.  Skin:    General: Skin is warm and dry.     Capillary Refill: Capillary refill takes less than 2 seconds.     Coloration: Skin is not jaundiced or pale.     Findings: No bruising, erythema, lesion or rash.  Neurological:     General: No focal deficit present.  Mental Status: She is alert and oriented to person, place, and time. Mental status is at baseline.  Psychiatric:        Mood and Affect: Mood normal.        Behavior: Behavior normal.        Thought Content: Thought content normal.        Judgment: Judgment normal.     Results for orders placed or performed in visit on 04/16/23  Cytology - PAP   Collection Time: 04/16/23  3:19 PM  Result Value Ref Range   High risk HPV Negative    Adequacy Satisfactory for evaluation.    Diagnosis      - Negative for intraepithelial lesion or malignancy (NILM)   Comment Normal Reference Range HPV - Negative   Microscopic Examination   Collection Time: 04/16/23  3:43 PM   Urine  Result Value Ref Range   WBC, UA 0-5 0 - 5 /hpf   RBC, Urine 0-2 0 - 2 /hpf   Epithelial Cells (non renal) 0-10 0 - 10 /hpf   Bacteria, UA None seen None seen/Few   Urinalysis, Routine w reflex microscopic   Collection Time: 04/16/23  3:43 PM  Result Value Ref Range   Specific Gravity, UA 1.025 1.005 - 1.030   pH, UA 5.5 5.0 - 7.5   Color, UA Yellow Yellow   Appearance Ur Clear Clear   Leukocytes,UA Trace (A) Negative   Protein,UA Negative Negative/Trace   Glucose, UA Negative Negative   Ketones, UA Negative Negative   RBC, UA Trace (A) Negative   Bilirubin, UA Negative Negative   Urobilinogen, Ur 0.2 0.2 - 1.0 mg/dL   Nitrite, UA Negative Negative   Microscopic Examination See below:   Basic metabolic panel   Collection Time: 04/16/23  3:48 PM  Result Value Ref Range   Glucose 79 70 - 99 mg/dL   BUN 18 8 - 27 mg/dL   Creatinine, Ser 5.40 0.57 - 1.00 mg/dL   eGFR 68 >98 JX/BJY/7.82   BUN/Creatinine Ratio 19 12 - 28   Sodium 139 134 - 144 mmol/L   Potassium 4.2 3.5 - 5.2 mmol/L   Chloride 102 96 - 106 mmol/L   CO2 19 (L) 20 - 29 mmol/L   Calcium 9.2 8.7 - 10.3 mg/dL  Urine Culture   Collection Time: 04/17/23  8:14 AM   Specimen: Urine   UR  Result Value Ref Range   Urine Culture, Routine Final report    Organism ID, Bacteria Comment       Assessment & Plan:   Problem List Items Addressed This Visit       Cardiovascular and Mediastinum   Hypertension - Primary   Running a little high- will increase her lisinopril to 20mg  and recheck in 2 months with BMP at that time. Continue to monitor. Call with any concerns.      Relevant Medications   lisinopril (ZESTRIL) 20 MG tablet     Follow up plan: Return in about 2 months (around 12/15/2023).

## 2023-10-15 NOTE — Patient Instructions (Signed)
 Please call to schedule your mammogram and/or bone density: [facility]  Address: [address], [place], [place] [address] Phone: [phone]  [facility] [address] [place],  [place]  [address] Phone: [phone]

## 2023-10-15 NOTE — Assessment & Plan Note (Signed)
 Running a little high- will increase her lisinopril to 20mg  and recheck in 2 months with BMP at that time. Continue to monitor. Call with any concerns.

## 2023-10-26 ENCOUNTER — Ambulatory Visit: Admitting: Family Medicine

## 2023-10-26 ENCOUNTER — Ambulatory Visit: Payer: Self-pay

## 2023-10-26 ENCOUNTER — Encounter: Payer: Self-pay | Admitting: Family Medicine

## 2023-10-26 VITALS — BP 146/74 | HR 54 | Ht 59.0 in | Wt 169.0 lb

## 2023-10-26 DIAGNOSIS — H6991 Unspecified Eustachian tube disorder, right ear: Secondary | ICD-10-CM

## 2023-10-26 DIAGNOSIS — H60331 Swimmer's ear, right ear: Secondary | ICD-10-CM | POA: Diagnosis not present

## 2023-10-26 MED ORDER — CIPROFLOXACIN-DEXAMETHASONE 0.3-0.1 % OT SUSP: 4.0000 [drp] | Freq: Two times a day (BID) | OTIC | 0 refills | Status: DC

## 2023-10-26 NOTE — Telephone Encounter (Signed)
 Chief Complaint: Earache Symptoms: dizziness, nausea Frequency: x 2 days Pertinent Negatives: Patient denies fever, nasal congestion Disposition: [] ED /[] Urgent Care (no appt availability in office) / [x] Appointment(In office/virtual)/ []  [facility]/ [] Home Care/ [] Refused Recommended Disposition /[] [place] [facility]/ []  Follow-up with PCP Additional Notes: Pt reports she was seen on 04/17 for similar symptoms that resolved but returned x 2 days ago. Pt reports right ear pain at 7/10, dizziness, nausea. Denies fever but notes feeling "heat" in her ear when placing drops. Pt requests abx sent to pharmacy, advised appt and pt is agreeable but requests MD send script and cancel appt if appropriate. This RN educated pt on home care, new-worsening symptoms, when to call back/seek emergent care. Pt verbalized understanding and agrees to plan.    Copied from CRM [id number]. Topic: Clinical - Red Word Triage >> Oct 26, 2023  9:50 AM Rennis Case wrote: Red Word that prompted transfer to Nurse Triage: Earache on right side, dizziness & nauseated, sore throat on side where earache is Reason for Disposition  Earache  (Exceptions: brief ear pain of < 60 minutes duration, earache occurring during air travel  Answer Assessment - Initial Assessment Questions 1. LOCATION: "Which ear is involved?"     Right 2. ONSET: "When did the ear start hurting"      X 2 days 3. SEVERITY: "How bad is the pain?"  (Scale 1-10; mild, moderate or severe)   - MILD (1-3): doesn't interfere with normal activities    - MODERATE (4-7): interferes with normal activities or awakens from sleep    - SEVERE (8-10): excruciating pain, unable to do any normal activities      7/10, 10/10 on Saturday 4. URI SYMPTOMS: "Do you have a runny nose or cough?"     Runny nose, right; irritated throat 5. FEVER: "Do you have a fever?" If Yes, ask: "What is your temperature, how was it measured, and when did it start?"     None, when  she puts ear drops in she feels heat in her ear 7. OTHER SYMPTOMS: "Do you have any other symptoms?" (e.g., headache, stiff neck, dizziness, vomiting, runny nose, decreased hearing)     Dizziness, nausea  Protocols used: Earache-A-AH

## 2023-10-26 NOTE — Telephone Encounter (Signed)
 Was seen in march for her ears. Needs to keep appt

## 2023-10-26 NOTE — Progress Notes (Signed)
 BP (!) 146/74 (BP Location: Left Arm, Patient Position: Sitting, Cuff Size: Normal)   Pulse (!) 54   Ht 4\' 11"  (1.499 m)   Wt 169 lb (76.7 kg)   LMP 02/20/2017   SpO2 99%   BMI 34.13 kg/m    Subjective:    Patient ID: Megan Bruce, female    DOB: Sep 21, 1960, 63 y.o.   MRN: [id number]  HPI: Megan Bruce is a 63 y.o. female  Chief Complaint  Patient presents with   Ear Pain    Right pain with some dizziness. OTC meds no relief.    EAR PAIN Duration: 3 days Involved ear(s): right Severity:  moderate  Quality:  irritating, stabbing pain Fever: no Otorrhea: no Upper respiratory infection symptoms: yes Pruritus: no Hearing loss: no Water immersion no Using Q-tips: yes Recurrent otitis media: no Status: worse Treatments attempted: none   Relevant past medical, surgical, family and social history reviewed and updated as indicated. Interim medical history since our last visit reviewed. Allergies and medications reviewed and updated.  Review of Systems  Constitutional: Negative.   HENT:  Positive for ear pain, postnasal drip and rhinorrhea. Negative for congestion, dental problem, drooling, ear discharge, facial swelling, hearing loss, mouth sores, nosebleeds, sinus pressure, sinus pain, sneezing, sore throat, tinnitus, trouble swallowing and voice change.   Respiratory: Negative.    Cardiovascular: Negative.   Musculoskeletal: Negative.   Neurological: Negative.   Psychiatric/Behavioral: Negative.      Per HPI unless specifically indicated above     Objective:    BP (!) 146/74 (BP Location: Left Arm, Patient Position: Sitting, Cuff Size: Normal)   Pulse (!) 54   Ht 4\' 11"  (1.499 m)   Wt 169 lb (76.7 kg)   LMP 02/20/2017   SpO2 99%   BMI 34.13 kg/m   Wt Readings from Last 3 Encounters:  10/26/23 169 lb (76.7 kg)  10/15/23 167 lb (75.8 kg)  09/24/23 165 lb 3.2 oz (74.9 kg)    Physical Exam Vitals and nursing note reviewed.  Constitutional:       General: She is not in acute distress.    Appearance: Normal appearance. She is obese. She is not ill-appearing, toxic-appearing or diaphoretic.  HENT:     Head: Normocephalic and atraumatic.     Right Ear: Tympanic membrane and external ear normal. There is no impacted cerumen.     Left Ear: Tympanic membrane, ear canal and external ear normal. There is no impacted cerumen.     Ears:     Comments: Mild redness and swelling in R EAC    Nose: Nose normal. No congestion or rhinorrhea.     Mouth/Throat:     Mouth: Mucous membranes are moist.     Pharynx: Oropharynx is clear. No oropharyngeal exudate or posterior oropharyngeal erythema.  Eyes:     General: No scleral icterus.       Right eye: No discharge.        Left eye: No discharge.     Extraocular Movements: Extraocular movements intact.     Conjunctiva/sclera: Conjunctivae normal.     Pupils: Pupils are equal, round, and reactive to light.  Neck:     Vascular: No carotid bruit.  Cardiovascular:     Rate and Rhythm: Normal rate and regular rhythm.     Pulses: Normal pulses.     Heart sounds: Normal heart sounds. No murmur heard.    No friction rub. No gallop.  Pulmonary:     Effort:  Pulmonary effort is normal. No respiratory distress.     Breath sounds: Normal breath sounds. No stridor. No wheezing, rhonchi or rales.  Chest:     Chest wall: No tenderness.  Musculoskeletal:        General: Normal range of motion.     Cervical back: Normal range of motion and neck supple. No rigidity or tenderness.  Lymphadenopathy:     Cervical: Cervical adenopathy present.  Skin:    General: Skin is warm and dry.     Capillary Refill: Capillary refill takes less than 2 seconds.     Coloration: Skin is not jaundiced or pale.     Findings: No bruising, erythema, lesion or rash.  Neurological:     General: No focal deficit present.     Mental Status: She is alert and oriented to person, place, and time. Mental status is at baseline.   Psychiatric:        Mood and Affect: Mood normal.        Behavior: Behavior normal.        Thought Content: Thought content normal.        Judgment: Judgment normal.     Results for orders placed or performed in visit on 04/16/23  Cytology - PAP   Collection Time: 04/16/23  3:19 PM  Result Value Ref Range   High risk HPV Negative    Adequacy Satisfactory for evaluation.    Diagnosis      - Negative for intraepithelial lesion or malignancy (NILM)   Comment Normal Reference Range HPV - Negative   Microscopic Examination   Collection Time: 04/16/23  3:43 PM   Urine  Result Value Ref Range   WBC, UA 0-5 0 - 5 /hpf   RBC, Urine 0-2 0 - 2 /hpf   Epithelial Cells (non renal) 0-10 0 - 10 /hpf   Bacteria, UA None seen None seen/Few  Urinalysis, Routine w reflex microscopic   Collection Time: 04/16/23  3:43 PM  Result Value Ref Range   Specific Gravity, UA 1.025 1.005 - 1.030   pH, UA 5.5 5.0 - 7.5   Color, UA Yellow Yellow   Appearance Ur Clear Clear   Leukocytes,UA Trace (A) Negative   Protein,UA Negative Negative/Trace   Glucose, UA Negative Negative   Ketones, UA Negative Negative   RBC, UA Trace (A) Negative   Bilirubin, UA Negative Negative   Urobilinogen, Ur 0.2 0.2 - 1.0 mg/dL   Nitrite, UA Negative Negative   Microscopic Examination See below:   Basic metabolic panel   Collection Time: 04/16/23  3:48 PM  Result Value Ref Range   Glucose 79 70 - 99 mg/dL   BUN 18 8 - 27 mg/dL   Creatinine, Ser 6.96 0.57 - 1.00 mg/dL   eGFR 68 >29 BM/WUX/3.24   BUN/Creatinine Ratio 19 12 - 28   Sodium 139 134 - 144 mmol/L   Potassium 4.2 3.5 - 5.2 mmol/L   Chloride 102 96 - 106 mmol/L   CO2 19 (L) 20 - 29 mmol/L   Calcium 9.2 8.7 - 10.3 mg/dL  Urine Culture   Collection Time: 04/17/23  8:14 AM   Specimen: Urine   UR  Result Value Ref Range   Urine Culture, Routine Final report    Organism ID, Bacteria Comment       Assessment & Plan:   Problem List Items Addressed This  Visit   None Visit Diagnoses       Acute swimmer's ear of right  side    -  Primary   Will treat with ciprodex. Call with any concerns. Continue to monitor.     Dysfunction of right eustachian tube       Will refer to ENT. Call with any concerns.   Relevant Orders   Ambulatory referral to ENT        Follow up plan: Return if symptoms worsen or fail to improve.

## 2023-12-15 ENCOUNTER — Ambulatory Visit: Admitting: Nurse Practitioner

## 2024-01-24 ENCOUNTER — Other Ambulatory Visit: Payer: Self-pay | Admitting: Family Medicine

## 2024-01-26 NOTE — Telephone Encounter (Signed)
 Patient will be out before upcoming appointment.

## 2024-01-26 NOTE — Telephone Encounter (Signed)
 Requested medication (s) are due for refill today: yes  Requested medication (s) are on the active medication list: yes  Last refill:  10/15/23 #90  Future visit scheduled: yes  Notes to clinic:  overdue lab work    Requested Prescriptions  Pending Prescriptions Disp Refills   lisinopril  (ZESTRIL ) 20 MG tablet [Pharmacy Med Name: LISINOPRIL  20MG  TABLETS] 90 tablet 0    Sig: TAKE 1 TABLET(20 MG) BY MOUTH DAILY     Cardiovascular:  ACE Inhibitors Failed - 01/26/2024  8:04 AM      Failed - Cr in normal range and within 180 days    Creat  Date Value Ref Range Status  08/28/2016 0.76 0.50 - 1.05 mg/dL Final    Comment:      For patients > or = 61 years of age: The upper reference limit for Creatinine is approximately 13% higher for people identified as African-American.      Creatinine, Ser  Date Value Ref Range Status  04/16/2023 0.96 0.57 - 1.00 mg/dL Final         Failed - K in normal range and within 180 days    Potassium  Date Value Ref Range Status  04/16/2023 4.2 3.5 - 5.2 mmol/L Final         Failed - Last BP in normal range    BP Readings from Last 1 Encounters:  10/26/23 (!) 146/74         Passed - Patient is not pregnant      Passed - Valid encounter within last 6 months    Recent Outpatient Visits           3 months ago Acute swimmer's ear of right side   [facility] [place], Paton, DO   3 months ago Primary hypertension   [place] [place], Megan P, DO   4 months ago Dysfunction of both eustachian tubes   [facility] Melvin Pao, NP

## 2024-02-09 ENCOUNTER — Ambulatory Visit: Admitting: Family Medicine

## 2024-02-09 ENCOUNTER — Encounter: Payer: Self-pay | Admitting: Family Medicine

## 2024-02-09 VITALS — BP 133/70 | HR 58 | Temp 98.0°F | Ht 59.0 in | Wt 167.0 lb

## 2024-02-09 DIAGNOSIS — I10 Essential (primary) hypertension: Secondary | ICD-10-CM

## 2024-02-09 DIAGNOSIS — Z1211 Encounter for screening for malignant neoplasm of colon: Secondary | ICD-10-CM | POA: Diagnosis not present

## 2024-02-09 MED ORDER — OMEPRAZOLE 20 MG PO CPDR: 20.0000 mg | DELAYED_RELEASE_CAPSULE | Freq: Every day | ORAL | 1 refills | Status: AC | PRN

## 2024-02-09 MED ORDER — LISINOPRIL 20 MG PO TABS: 20.0000 mg | ORAL_TABLET | Freq: Every day | ORAL | 1 refills | Status: AC

## 2024-02-09 NOTE — Progress Notes (Signed)
 BP 133/70   Pulse (!) 58   Temp 98 F (36.7 C) (Oral)   Ht 4' 11 (1.499 m)   Wt 167 lb (75.8 kg)   LMP 02/20/2017   SpO2 97%   BMI 33.73 kg/m    Subjective:    Patient ID: Megan Bruce, female    DOB: [birth year], 63 y.o.   MRN: [id number]  HPI: Megan Bruce is a 63 y.o. female  Chief Complaint  Patient presents with   Hypertension   HYPERTENSION  Hypertension status: controlled  Satisfied with current treatment? yes Duration of hypertension: chronic BP monitoring frequency:  not checking BP medication side effects:  no Medication compliance: excellent compliance Previous BP meds: lisinopril  Aspirin: no Recurrent headaches: no Visual changes: no Palpitations: no Dyspnea: no Chest pain: no Lower extremity edema: no Dizzy/lightheaded: no  Relevant past medical, surgical, family and social history reviewed and updated as indicated. Interim medical history since our last visit reviewed. Allergies and medications reviewed and updated.  Review of Systems  Constitutional: Negative.   Respiratory: Negative.    Cardiovascular: Negative.   Musculoskeletal: Negative.   Psychiatric/Behavioral: Negative.      Per HPI unless specifically indicated above     Objective:    BP 133/70   Pulse (!) 58   Temp 98 F (36.7 C) (Oral)   Ht 4' 11 (1.499 m)   Wt 167 lb (75.8 kg)   LMP 02/20/2017   SpO2 97%   BMI 33.73 kg/m   Wt Readings from Last 3 Encounters:  02/09/24 167 lb (75.8 kg)  10/26/23 169 lb (76.7 kg)  10/15/23 167 lb (75.8 kg)    Physical Exam Vitals and nursing note reviewed.  Constitutional:      General: She is not in acute distress.    Appearance: Normal appearance. She is not ill-appearing, toxic-appearing or diaphoretic.  HENT:     Head: Normocephalic and atraumatic.     Right Ear: External ear normal.     Left Ear: External ear normal.     Nose: Nose normal.     Mouth/Throat:     Mouth: Mucous membranes are moist.     Pharynx: Oropharynx  is clear.  Eyes:     General: No scleral icterus.       Right eye: No discharge.        Left eye: No discharge.     Extraocular Movements: Extraocular movements intact.     Conjunctiva/sclera: Conjunctivae normal.     Pupils: Pupils are equal, round, and reactive to light.  Cardiovascular:     Rate and Rhythm: Normal rate and regular rhythm.     Pulses: Normal pulses.     Heart sounds: Normal heart sounds. No murmur heard.    No friction rub. No gallop.  Pulmonary:     Effort: Pulmonary effort is normal. No respiratory distress.     Breath sounds: Normal breath sounds. No stridor. No wheezing, rhonchi or rales.  Chest:     Chest wall: No tenderness.  Musculoskeletal:        General: Normal range of motion.     Cervical back: Normal range of motion and neck supple.  Skin:    General: Skin is warm and dry.     Capillary Refill: Capillary refill takes less than 2 seconds.     Coloration: Skin is not jaundiced or pale.     Findings: No bruising, erythema, lesion or rash.  Neurological:     General: No focal  deficit present.     Mental Status: She is alert and oriented to person, place, and time. Mental status is at baseline.  Psychiatric:        Mood and Affect: Mood normal.        Behavior: Behavior normal.        Thought Content: Thought content normal.        Judgment: Judgment normal.     Results for orders placed or performed in visit on 04/16/23  Cytology - PAP   Collection Time: 04/16/23  3:19 PM  Result Value Ref Range   High risk HPV Negative    Adequacy Satisfactory for evaluation.    Diagnosis      - Negative for intraepithelial lesion or malignancy (NILM)   Comment Normal Reference Range HPV - Negative   Microscopic Examination   Collection Time: 04/16/23  3:43 PM   Urine  Result Value Ref Range   WBC, UA 0-5 0 - 5 /hpf   RBC, Urine 0-2 0 - 2 /hpf   Epithelial Cells (non renal) 0-10 0 - 10 /hpf   Bacteria, UA None seen None seen/Few  Urinalysis, Routine w  reflex microscopic   Collection Time: 04/16/23  3:43 PM  Result Value Ref Range   Specific Gravity, UA 1.025 1.005 - 1.030   pH, UA 5.5 5.0 - 7.5   Color, UA Yellow Yellow   Appearance Ur Clear Clear   Leukocytes,UA Trace (A) Negative   Protein,UA Negative Negative/Trace   Glucose, UA Negative Negative   Ketones, UA Negative Negative   RBC, UA Trace (A) Negative   Bilirubin, UA Negative Negative   Urobilinogen, Ur 0.2 0.2 - 1.0 mg/dL   Nitrite, UA Negative Negative   Microscopic Examination See below:   Basic metabolic panel   Collection Time: 04/16/23  3:48 PM  Result Value Ref Range   Glucose 79 70 - 99 mg/dL   BUN 18 8 - 27 mg/dL   Creatinine, Ser 9.03 0.57 - 1.00 mg/dL   eGFR 68 >40 fO/fpw/8.26   BUN/Creatinine Ratio 19 12 - 28   Sodium 139 134 - 144 mmol/L   Potassium 4.2 3.5 - 5.2 mmol/L   Chloride 102 96 - 106 mmol/L   CO2 19 (L) 20 - 29 mmol/L   Calcium 9.2 8.7 - 10.3 mg/dL  Urine Culture   Collection Time: 04/17/23  8:14 AM   Specimen: Urine   UR  Result Value Ref Range   Urine Culture, Routine Final report    Organism ID, Bacteria Comment       Assessment & Plan:   Problem List Items Addressed This Visit       Cardiovascular and Mediastinum   Hypertension - Primary   Under good control on current regimen. Continue current regimen. Continue to monitor. Call with any concerns. Refills given. Labs drawn today.        Relevant Medications   lisinopril  (ZESTRIL ) 20 MG tablet   Other Relevant Orders   Basic metabolic panel with GFR   Other Visit Diagnoses       Screening for colon cancer       Cologuard ordered today.   Relevant Orders   Cologuard        Follow up plan: Return in about 6 months (around 08/11/2024) for physical.

## 2024-02-09 NOTE — Assessment & Plan Note (Signed)
 Under good control on current regimen. Continue current regimen. Continue to monitor. Call with any concerns. Refills given. Labs drawn today.

## 2024-02-10 ENCOUNTER — Ambulatory Visit: Payer: Self-pay | Admitting: Family Medicine

## 2024-02-10 LAB — BASIC METABOLIC PANEL WITH GFR
BUN/Creatinine Ratio: 18 (ref 12–28)
BUN: 17 mg/dL (ref 8–27)
CO2: 21 mmol/L (ref 20–29)
Calcium: 9.5 mg/dL (ref 8.7–10.3)
Chloride: 105 mmol/L (ref 96–106)
Creatinine, Ser: 0.95 mg/dL (ref 0.57–1.00)
Glucose: 83 mg/dL (ref 70–99)
Potassium: 3.9 mmol/L (ref 3.5–5.2)
Sodium: 142 mmol/L (ref 134–144)
eGFR: 68 mL/min/1.73 (ref >59)

## 2024-04-04 ENCOUNTER — Ambulatory Visit: Payer: Self-pay

## 2024-04-04 ENCOUNTER — Ambulatory Visit: Admitting: Nurse Practitioner

## 2024-04-04 ENCOUNTER — Encounter: Payer: Self-pay | Admitting: Nurse Practitioner

## 2024-04-04 VITALS — BP 122/79 | HR 69 | Temp 97.9°F | Ht 59.0 in | Wt 166.0 lb

## 2024-04-04 DIAGNOSIS — N3000 Acute cystitis without hematuria: Secondary | ICD-10-CM

## 2024-04-04 DIAGNOSIS — R300 Dysuria: Secondary | ICD-10-CM | POA: Diagnosis not present

## 2024-04-04 LAB — URINALYSIS, ROUTINE W REFLEX MICROSCOPIC
Bilirubin, UA: NEGATIVE
Glucose, UA: NEGATIVE
Ketones, UA: NEGATIVE
Nitrite, UA: NEGATIVE
Protein,UA: NEGATIVE
Specific Gravity, UA: 1.01 (ref 1.005–1.030)
Urobilinogen, Ur: 0.2 mg/dL (ref 0.2–1.0)
pH, UA: 5.5 (ref 5.0–7.5)

## 2024-04-04 LAB — MICROSCOPIC EXAMINATION

## 2024-04-04 LAB — WET PREP FOR TRICH, YEAST, CLUE
Clue Cell Exam: POSITIVE — AB
Trichomonas Exam: NEGATIVE
Yeast Exam: NEGATIVE

## 2024-04-04 MED ORDER — NITROFURANTOIN MONOHYD MACRO 100 MG PO CAPS: 100.0000 mg | ORAL_CAPSULE | Freq: Two times a day (BID) | ORAL | 0 refills | Status: AC

## 2024-04-04 NOTE — Telephone Encounter (Signed)
 FYI Only or Action Required?: FYI only for provider.  Patient was last seen in primary care on 02/09/2024 by Vicci Duwaine SQUIBB, DO.  Called Nurse Triage reporting Dysuria.  Symptoms began several days ago.  Interventions attempted: OTC medications: ibuprofen and Other: drinking extra fluids and cranberry juice.  Symptoms are: urinary retention/urgency/frequency; burning and pressure with urination, chills gradually worsening.  Triage Disposition: See Physician Within 24 Hours  Patient/caregiver understands and will follow disposition?: Yes              Copied from CRM [id number]. Topic: Clinical - Red Word Triage >> Apr 04, 2024  7:42 AM Charlet HERO wrote: Red Word that prompted transfer to Nurse Triage: Patient is stating that she has buring and pressure in vaginal area has been since Friday, she thinks she has a uti. Vicci Rolling Reason for Disposition  Age > 50 years  Answer Assessment - Initial Assessment Questions 1. SEVERITY: How bad is the pain?  (e.g., Scale 1-10; mild, moderate, or severe)     Burning & pressure. Not moderate but very uncomfortable.  2. FREQUENCY: How many times have you had painful urination today?      3-4 times.  3. PATTERN: Is pain present every time you urinate or just sometimes?      Every time she urinates and she states states when walking she also experiences the burning.  4. ONSET: When did the painful urination start?      Friday afternoon.  5. FEVER: Do you have a fever? If Yes, ask: What is your temperature, how was it measured, and when did it start?     No.  6. PAST UTI: Have you had a urine infection before? If Yes, ask: When was the last time? and What happened that time?      Yes. She states its been a while since her last one.  7. CAUSE: What do you think is causing the painful urination?  (e.g., UTI, scratch, Herpes sore)     UTI.  8. OTHER SYMPTOMS: Do you have any other symptoms? (e.g., blood  in urine, flank pain, genital sores, urgency, vaginal discharge)     Urinary frequency, urinary retention, urinary urgency,chills. Denies blood in urine, nausea, vomiting, back or flank pain.  9. PREGNANCY: Is there any chance you are pregnant? When was your last menstrual period?     N/A.  Protocols used: Urination Pain - Female-A-AH

## 2024-04-04 NOTE — Progress Notes (Signed)
 BP 122/79 (BP Location: Left Arm, Cuff Size: Normal)   Pulse 69   Temp 97.9 F (36.6 C) (Oral)   Ht 4' 11 (1.499 m)   Wt 166 lb (75.3 kg)   LMP 02/20/2017   SpO2 98%   BMI 33.53 kg/m    Subjective:    Patient ID: Megan Bruce, female    DOB: [birth year], 63 y.o.   MRN: [id number]  NOTE WRITTEN BY DNP STUDENT.  ASSESSMENT AND PLAN OF CARE REVIEWED WITH STUDENT, AGREE WITH ABOVE FINDINGS AND PLAN.   HPI: Megan Bruce is a 63 y.o. female here for an acute visit for urinary frequency, urgency, and pelvic pressure that has been ongoing since Friday. Pt endorses urgency, frequency, pelvic pressure, and burning and stinging with urination. Pt denies fevers, back pain, hematuria, or foul odor. Pt has had UTIs in the past and states this feels similar.   Chief Complaint  Patient presents with   Urinary Tract Infection    Patient states she has been having urinary frequency, urgency, and lower abdominal pain since Friday afternoon.    URINARY SYMPTOMS  Dysuria: burning Urinary frequency: yes Urgency: yes Small volume voids: yes Symptom severity: yes Urinary incontinence: no Foul odor: no Hematuria: no Abdominal pain: no Back pain: no Suprapubic pain/pressure: yes Flank pain: no Fever:  no Vomiting: no Relief with cranberry juice: yes Relief with pyridium: no Status: better/worse/stable Previous urinary tract infection: yes Recurrent urinary tract infection: no Sexual activity: No sexually active/monogomous/practicing safe sex History of sexually transmitted disease: no Penile discharge: no Treatments attempted: cranberry    Relevant past medical, surgical, family and social history reviewed and updated as indicated. Interim medical history since our last visit reviewed. Allergies and medications reviewed and updated.  Review of Systems  Constitutional:  Negative for chills and fever.  Respiratory:  Negative for chest tightness and shortness of breath.    Cardiovascular:  Negative for chest pain and palpitations.  Gastrointestinal:  Negative for abdominal pain, constipation and nausea.  Genitourinary:  Positive for dysuria, frequency, pelvic pain and urgency. Negative for flank pain, hematuria, vaginal discharge and vaginal pain.  Neurological:  Negative for weakness and light-headedness.  Psychiatric/Behavioral:  Negative for confusion.     Per HPI unless specifically indicated above     Objective:    BP 122/79 (BP Location: Left Arm, Cuff Size: Normal)   Pulse 69   Temp 97.9 F (36.6 C) (Oral)   Ht 4' 11 (1.499 m)   Wt 166 lb (75.3 kg)   LMP 02/20/2017   SpO2 98%   BMI 33.53 kg/m   Wt Readings from Last 3 Encounters:  04/04/24 166 lb (75.3 kg)  02/09/24 167 lb (75.8 kg)  10/26/23 169 lb (76.7 kg)    Physical Exam Vitals and nursing note reviewed.  Constitutional:      General: She is not in acute distress.    Appearance: Normal appearance. She is not ill-appearing, toxic-appearing or diaphoretic.  HENT:     Head: Normocephalic.     Nose: Nose normal.     Mouth/Throat:     Mouth: Mucous membranes are moist.     Pharynx: No oropharyngeal exudate.  Eyes:     General:        Right eye: No discharge.        Left eye: No discharge.     Extraocular Movements: Extraocular movements intact.     Conjunctiva/sclera: Conjunctivae normal.     Pupils: Pupils are equal, round,  and reactive to light.  Cardiovascular:     Rate and Rhythm: Normal rate and regular rhythm.     Pulses: Normal pulses.     Heart sounds: Normal heart sounds. No murmur heard. Pulmonary:     Effort: Pulmonary effort is normal. No respiratory distress.     Breath sounds: Normal breath sounds. No wheezing, rhonchi or rales.  Abdominal:     General: Abdomen is flat. Bowel sounds are normal. There is no distension.     Palpations: Abdomen is soft.     Tenderness: There is abdominal tenderness. There is no right CVA tenderness or left CVA tenderness.      Comments: Suprapubic tenderness  Musculoskeletal:     Cervical back: Normal range of motion and neck supple.  Skin:    General: Skin is warm and dry.     Capillary Refill: Capillary refill takes less than 2 seconds.  Neurological:     General: No focal deficit present.     Mental Status: She is alert and oriented to person, place, and time.  Psychiatric:        Mood and Affect: Mood normal.        Behavior: Behavior normal.        Thought Content: Thought content normal.        Judgment: Judgment normal.     Results for orders placed or performed in visit on 02/09/24  Basic metabolic panel with GFR   Collection Time: 02/09/24  3:45 PM  Result Value Ref Range   Glucose 83 70 - 99 mg/dL   BUN 17 8 - 27 mg/dL   Creatinine, Ser 9.04 0.57 - 1.00 mg/dL   eGFR 68 >40 fO/fpw/8.26   BUN/Creatinine Ratio 18 12 - 28   Sodium 142 134 - 144 mmol/L   Potassium 3.9 3.5 - 5.2 mmol/L   Chloride 105 96 - 106 mmol/L   CO2 21 20 - 29 mmol/L   Calcium 9.5 8.7 - 10.3 mg/dL      Assessment & Plan:   Problem List Items Addressed This Visit   None Visit Diagnoses       Acute cystitis without hematuria    -  Primary   Prescribed macrobid  100mg  BID for 5 days. UA/Culture pending. Follow-up as needed if symptoms worsen or do not resolve.   Relevant Orders   Urine Culture     Dysuria       Prescribed macrobid  100mg  BID for 5 days. UA/Culture pending. Follow-up as needed if symptoms worsen or do not resolve.   Relevant Orders   Urinalysis, Routine w reflex microscopic   WET PREP FOR TRICH, YEAST, CLUE        Follow up plan: No follow-ups on file.

## 2024-04-05 ENCOUNTER — Ambulatory Visit: Payer: Self-pay | Admitting: Nurse Practitioner

## 2024-04-05 MED ORDER — METRONIDAZOLE 500 MG PO TABS: 500.0000 mg | ORAL_TABLET | Freq: Two times a day (BID) | ORAL | 0 refills | Status: AC

## 2024-04-07 NOTE — Telephone Encounter (Signed)
 Copied from CRM [id number]. Topic: Clinical - Lab/Test Results >> Apr 07, 2024  9:51 AM Ivette P wrote: Reason for CRM: Pt called in about her lab results.  Read as follows:[name], [name], NP to [facility]-Clinical Pod A     04/05/24  8:11 AM Result Note Please let patient know that her urine did have a small amount of bacteria. I have sent the urine out for culture to make sure she is on the right treatment.  I have sent in a prescription for Flagyl due to her swab showing that she was positive for bacteria vaginosis.  This is an over growth of normal bacteria and could be contributing to her symptoms.  Pt understood no further questions

## 2024-04-08 LAB — URINE CULTURE

## 2024-05-31 ENCOUNTER — Other Ambulatory Visit (HOSPITAL_COMMUNITY): Payer: Self-pay

## 2024-06-01 ENCOUNTER — Telehealth: Payer: Self-pay | Admitting: Pharmacy Technician

## 2024-06-01 ENCOUNTER — Other Ambulatory Visit (HOSPITAL_COMMUNITY): Payer: Self-pay

## 2024-06-01 NOTE — Telephone Encounter (Signed)
 Pharmacy Patient Advocate Encounter   Received notification from Onbase that prior authorization for Omeprazole  20MG  dr capsules is required/requested.   Insurance verification completed.   The patient is insured through [facility].   Per test claim: PA required; PA submitted to above mentioned insurance via Latent Key/confirmation #/EOC [id number] Status is pending

## 2024-06-02 ENCOUNTER — Other Ambulatory Visit (HOSPITAL_COMMUNITY): Payer: Self-pay

## 2024-06-02 NOTE — Telephone Encounter (Signed)
 Called and LVM asking for patient to please return my call.   OK for E2C2 to speak to patient and let her know that her Ozempic has been approved through her insurance.

## 2024-06-02 NOTE — Telephone Encounter (Signed)
 Pharmacy Patient Advocate Encounter  Received notification from [facility] that Prior Authorization for Omeprazole  20MG  dr capsules has been APPROVED from 06/01/2024 to 06/01/2025. Ran test claim, Copay is $9.42/90 day supply. This test claim was processed through [facility]- copay amounts may vary at other pharmacies due to pharmacy/plan contracts, or as the patient moves through the different stages of their insurance plan.   PA #/Case ID/Reference #: [id number]

## 2024-08-15 ENCOUNTER — Encounter: Admitting: Family Medicine
